# Patient Record
Sex: Female | Born: 1975 | Hispanic: Yes | Marital: Married | Smoking: Never smoker
Health system: Southern US, Community
[De-identification: ages and names within clinical notes are randomized; demographics above are authoritative.]

## PROBLEM LIST (undated history)

## (undated) DIAGNOSIS — G43909 Migraine, unspecified, not intractable, without status migrainosus: Secondary | ICD-10-CM

## (undated) DIAGNOSIS — D649 Anemia, unspecified: Secondary | ICD-10-CM

## (undated) DIAGNOSIS — K219 Gastro-esophageal reflux disease without esophagitis: Secondary | ICD-10-CM

## (undated) DIAGNOSIS — G4700 Insomnia, unspecified: Secondary | ICD-10-CM

## (undated) DIAGNOSIS — T783XXA Angioneurotic edema, initial encounter: Secondary | ICD-10-CM

## (undated) DIAGNOSIS — L509 Urticaria, unspecified: Secondary | ICD-10-CM

## (undated) HISTORY — DX: Urticaria, unspecified: L50.9

## (undated) HISTORY — DX: Migraine, unspecified, not intractable, without status migrainosus: G43.909

## (undated) HISTORY — DX: Angioneurotic edema, initial encounter: T78.3XXA

## (undated) HISTORY — PX: ABDOMINAL HYSTERECTOMY: SHX81

## (undated) HISTORY — DX: Insomnia, unspecified: G47.00

## (undated) HISTORY — DX: Anemia, unspecified: D64.9

---

## 1998-10-07 HISTORY — PX: LITHOTRIPSY: SUR834

## 1999-09-21 ENCOUNTER — Encounter: Payer: Self-pay | Admitting: Urology

## 1999-09-21 ENCOUNTER — Ambulatory Visit (HOSPITAL_COMMUNITY): Admission: RE | Admit: 1999-09-21 | Discharge: 1999-09-21 | Payer: Self-pay | Admitting: Urology

## 1999-09-27 ENCOUNTER — Encounter: Payer: Self-pay | Admitting: Urology

## 1999-09-27 ENCOUNTER — Encounter: Admission: RE | Admit: 1999-09-27 | Discharge: 1999-09-27 | Payer: Self-pay | Admitting: Urology

## 2001-12-17 ENCOUNTER — Other Ambulatory Visit: Admission: RE | Admit: 2001-12-17 | Discharge: 2001-12-17 | Payer: Self-pay | Admitting: Gynecology

## 2003-02-02 ENCOUNTER — Other Ambulatory Visit: Admission: RE | Admit: 2003-02-02 | Discharge: 2003-02-02 | Payer: Self-pay | Admitting: Internal Medicine

## 2003-09-26 ENCOUNTER — Ambulatory Visit (HOSPITAL_COMMUNITY): Admission: RE | Admit: 2003-09-26 | Discharge: 2003-09-26 | Payer: Self-pay | Admitting: Gynecology

## 2004-04-16 ENCOUNTER — Other Ambulatory Visit: Admission: RE | Admit: 2004-04-16 | Discharge: 2004-04-16 | Payer: Self-pay | Admitting: Gynecology

## 2004-12-21 IMAGING — RF DG HYSTEROGRAM
6 series · 6 of 6 positions shown · IV contrast (omnipaque)
Comparison: none

CLINICAL DATA: G1 P1.  Prior c-section.  LMP 09/19/03.  Assess for tubal patency.  
 HYSTEROSALPINGOGRAM:
 After cleansing of the cervix with Betadine, a 5 French hysterosalpingogram catheter was placed in the cervix and the balloon was inflated.  Approximately 7 cc of Omnipaque 300 was injected.
 The uterine cavity is normal.  Both fallopian tubes fill with contrast and are normal in caliber.  There is free intraperitoneal spill bilaterally with no loculation on post-drainage film.  
 IMPRESSION
 Normal hysterosalpingogram.

[Series 1: run · 1 of 1 slices shown (1 of 6)]
[im 1/1]
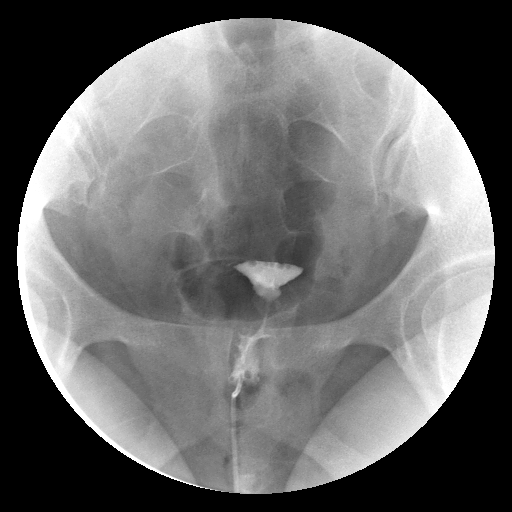

[Series 2: run · 1 of 1 slices shown (2 of 6)]
[im 1/1]
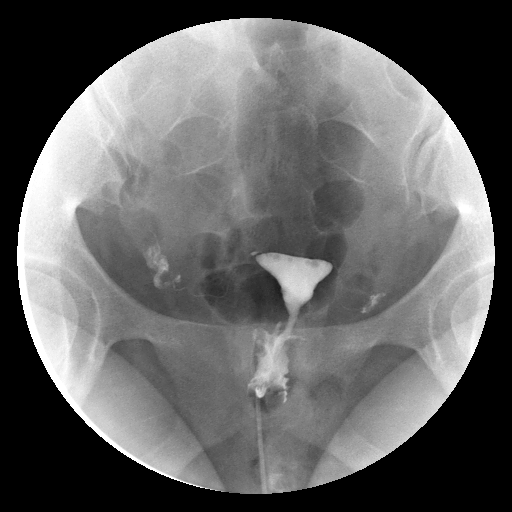

[Series 3: run · 1 of 1 slices shown (3 of 6)]
[im 1/1]
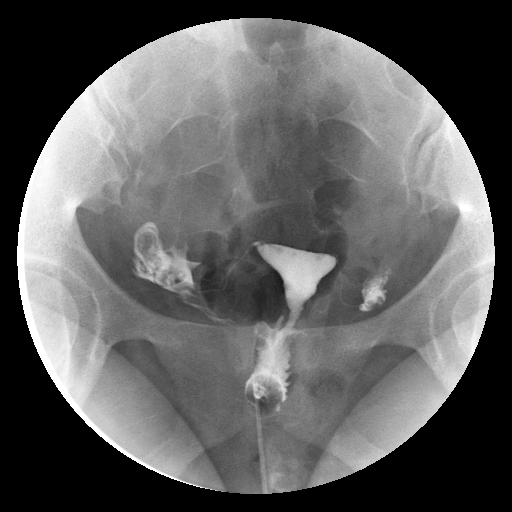

[Series 4: run · 1 of 1 slices shown (4 of 6)]
[im 1/1]
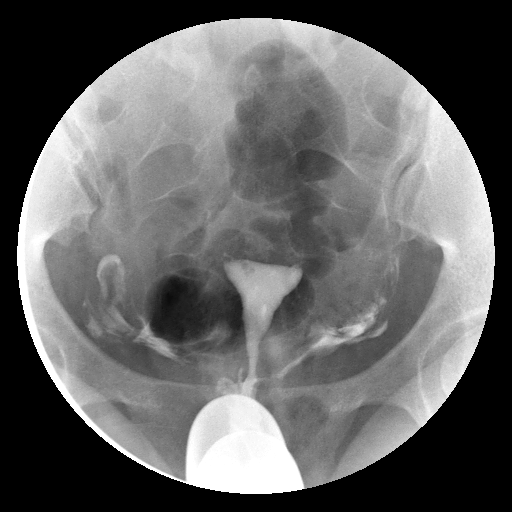

[Series 5: run · 1 of 1 slices shown (5 of 6)]
[im 1/1]
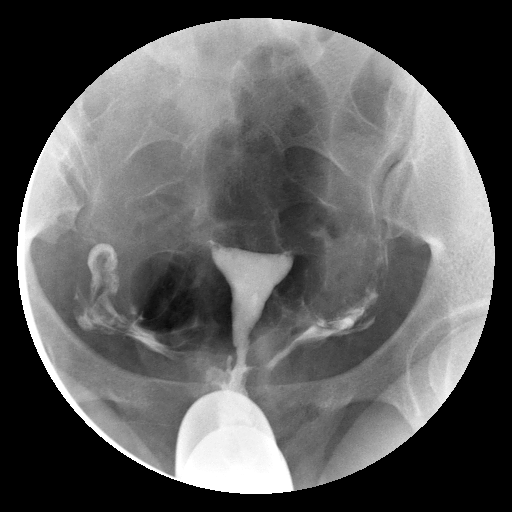

[Series 6: run · 1 of 1 slices shown (6 of 6)]
[im 1/1]
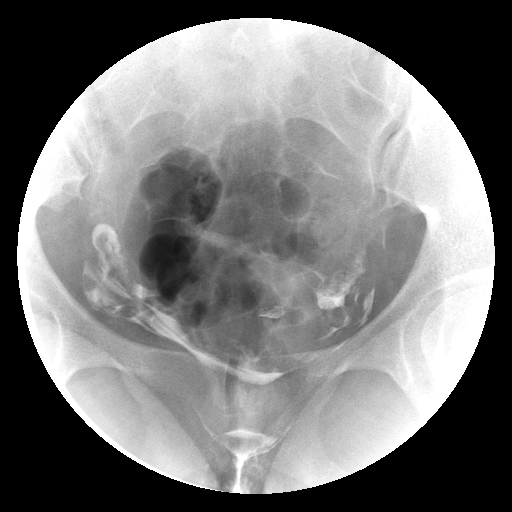

[6 of 6 positions shown; findings below may reference images not displayed]

## 2005-07-29 ENCOUNTER — Other Ambulatory Visit: Admission: RE | Admit: 2005-07-29 | Discharge: 2005-07-29 | Payer: Self-pay | Admitting: Gynecology

## 2005-12-16 ENCOUNTER — Encounter: Admission: RE | Admit: 2005-12-16 | Discharge: 2006-03-16 | Payer: Self-pay | Admitting: Endocrinology

## 2007-01-08 ENCOUNTER — Other Ambulatory Visit: Admission: RE | Admit: 2007-01-08 | Discharge: 2007-01-08 | Payer: Self-pay | Admitting: Gynecology

## 2008-02-16 ENCOUNTER — Other Ambulatory Visit: Admission: RE | Admit: 2008-02-16 | Discharge: 2008-02-16 | Payer: Self-pay | Admitting: Gynecology

## 2009-03-01 ENCOUNTER — Inpatient Hospital Stay (HOSPITAL_COMMUNITY): Admission: AD | Admit: 2009-03-01 | Discharge: 2009-03-01 | Payer: Self-pay | Admitting: Obstetrics & Gynecology

## 2009-04-07 ENCOUNTER — Other Ambulatory Visit: Payer: Self-pay | Admitting: Obstetrics and Gynecology

## 2009-04-11 ENCOUNTER — Inpatient Hospital Stay (HOSPITAL_COMMUNITY): Admission: AD | Admit: 2009-04-11 | Discharge: 2009-04-13 | Payer: Self-pay | Admitting: Obstetrics and Gynecology

## 2009-04-11 ENCOUNTER — Encounter (INDEPENDENT_AMBULATORY_CARE_PROVIDER_SITE_OTHER): Payer: Self-pay | Admitting: Obstetrics and Gynecology

## 2010-09-28 ENCOUNTER — Other Ambulatory Visit
Admission: RE | Admit: 2010-09-28 | Discharge: 2010-09-28 | Payer: Self-pay | Source: Home / Self Care | Admitting: Family Medicine

## 2011-01-13 LAB — CBC
HCT: 26.5 % — ABNORMAL LOW (ref 36.0–46.0)
HCT: 32 % — ABNORMAL LOW (ref 36.0–46.0)
Hemoglobin: 11 g/dL — ABNORMAL LOW (ref 12.0–15.0)
Hemoglobin: 8.9 g/dL — ABNORMAL LOW (ref 12.0–15.0)
MCHC: 33.5 g/dL (ref 30.0–36.0)
MCHC: 34.3 g/dL (ref 30.0–36.0)
MCV: 77.5 fL — ABNORMAL LOW (ref 78.0–100.0)
MCV: 78.2 fL (ref 78.0–100.0)
Platelets: 162 10*3/uL (ref 150–400)
Platelets: 183 10*3/uL (ref 150–400)
RBC: 3.39 MIL/uL — ABNORMAL LOW (ref 3.87–5.11)
RBC: 4.13 MIL/uL (ref 3.87–5.11)
RDW: 17.4 % — ABNORMAL HIGH (ref 11.5–15.5)
RDW: 17.6 % — ABNORMAL HIGH (ref 11.5–15.5)
WBC: 10.3 10*3/uL (ref 4.0–10.5)
WBC: 10.7 10*3/uL — ABNORMAL HIGH (ref 4.0–10.5)

## 2011-01-13 LAB — RPR: RPR Ser Ql: NONREACTIVE

## 2011-01-15 LAB — URINALYSIS, ROUTINE W REFLEX MICROSCOPIC
Bilirubin Urine: NEGATIVE
Glucose, UA: NEGATIVE mg/dL
Hgb urine dipstick: NEGATIVE
Ketones, ur: NEGATIVE mg/dL
Nitrite: NEGATIVE
Protein, ur: NEGATIVE mg/dL
Specific Gravity, Urine: 1.025 (ref 1.005–1.030)
Urobilinogen, UA: 0.2 mg/dL (ref 0.0–1.0)
pH: 5.5 (ref 5.0–8.0)

## 2011-01-15 LAB — DIFFERENTIAL
Basophils Absolute: 0 10*3/uL (ref 0.0–0.1)
Basophils Relative: 0 % (ref 0–1)
Lymphocytes Relative: 14 % (ref 12–46)
Neutro Abs: 11.2 10*3/uL — ABNORMAL HIGH (ref 1.7–7.7)
Neutrophils Relative %: 81 % — ABNORMAL HIGH (ref 43–77)

## 2011-01-15 LAB — CBC
MCHC: 33.9 g/dL (ref 30.0–36.0)
Platelets: 212 10*3/uL (ref 150–400)
RDW: 16.8 % — ABNORMAL HIGH (ref 11.5–15.5)

## 2011-02-19 NOTE — Op Note (Signed)
NAME:  Whitney Schultz, Whitney Schultz         ACCOUNT NO.:  [id number]   MEDICAL RECORD NO.:  [id number]          PATIENT TYPE:  INP   LOCATION:  9130                          FACILITY:  [facility]   PHYSICIAN:  Malva Limes, M.D.    DATE OF BIRTH:  June 05, 1976   DATE OF PROCEDURE:  DATE OF DISCHARGE:                               OPERATIVE REPORT   PREOPERATIVE DIAGNOSES:  1. Intrauterine pregnancy at term.  2. History of prior cesarean section.  3. The patient desires repeat cesarean section.  4. The patient desires permanent sterilization.   POSTOPERATIVE DIAGNOSES:  1. Intrauterine pregnancy at term.  2. History of prior cesarean section.  3. The patient desires repeat cesarean section.  4. The patient desires permanent sterilization.  5. Lysis of adhesions.   SURGEON:  Malva Limes, MD   ANESTHESIA:  Spinal.   ANTIBIOTICS:  Ancef 1 g.   DRAINS:  Foley bedside drainage.   ESTIMATED BLOOD LOSS:  900 mL.   SPECIMENS:  Portion of right and left fallopian tube sent to Pathology.   COMPLICATIONS:  None.   PROCEDURE IN DETAIL:  The patient was taken to the operating room where  a spinal anesthetic was administered without difficulty.  She was then  placed in dorsal supine position with a left lateral tilt.  She was  prepped and draped in the usual fashion for this procedure.  A Foley  catheter was placed in the bladder.  A Pfannenstiel incision was made  through the previous scar.  This was carried down with the subcuticular  layers where the fascia was entered and extended laterally.  The rectus  muscles were then dissected from the fascia.  Rectus muscles were  divided in the midline, taken superiorly and inferiorly.  The parietal  peritoneum was opened sharply.  On entering the abdominal cavity, the  patient was noted to have several omental adhesions.  There was also  adhesions between the anterior abdominal wall and the midportion of the  body of the uterus.  These were all taken  down with a Bovie.  At this  point, the bladder flap was taken down sharply.  A low transverse  uterine incision was made in the midline and extended laterally with  Blunt dissection.  Amniotic sac was entered.  The fluid was noted to be  clear.  The infant was then delivered in a vertex presentation.  On  delivery of the head, the oropharynx and nostrils were bulb suctioned.  The remaining infant was then delivered.  The cord doubly clamped and  cut and the infant handed to the awaiting NICU team.  Cord blood was  then obtained.  Placenta was manually removed.  The uterus was  exteriorized.  Uterine cavity was cleaned with a wet lap.  The uterine  incision was closed in a single layer of 0 Monocryl suture in a running  locking fashion.  Bladder flap was not repaired.  The right fallopian  tube was then grasped in the isthmic portion.  A 2-3 cm knuckle of  fallopian tube was doubly ligated with 0 gut suture.  The knuckle was  excised.  Both ostia were visualized.  Hemostasis appeared to be  adequate.  A similar procedure was performed on the left side.  The  uterus was then placed back in the abdominal cavity and doing this, it  was noted that there was an area of bleeding at the site of the tubal  ligation in the broad ligament.  This was made hemostatic with an  interrupted suture of 2-0 Monocryl.  Hemostasis again checked and all  found to be normal.  The parietal peritoneum and rectus muscles were  then reapproximated in the midline using 2-0 Monocryl in a running  fashion.  The fascia was closed using 0 Monocryl suture in running  fashion.  Subcuticular tissue was made hemostatic with the Bovie.  The  subcuticular tissue was then closed using 2-0 plain gut suture.  The  skin was closed using 3-0 Vicryl in a subcuticular fashion.  Steri-  Strips were then placed.  The patient tolerated the procedure well.  She  was taken to the recovery room in stable condition.  Instrument and lap   counts were correct x2.           ______________________________  Malva Limes, M.D.     MA/MEDQ  D:  04/11/2009  T:  04/12/2009  Job:  [id number]

## 2011-02-22 NOTE — Discharge Summary (Signed)
NAME:  Whitney Schultz, Whitney Schultz          ACCOUNT NO.:  [id number]   MEDICAL RECORD NO.:  [id number]          PATIENT TYPE:  INP   LOCATION:  9130                          FACILITY:  [facility]   PHYSICIAN:  Malva Limes, M.D.    DATE OF BIRTH:  [birth year]   DATE OF ADMISSION:  04/11/2009  DATE OF DISCHARGE:  04/13/2009                               DISCHARGE SUMMARY   FINAL DIAGNOSES:  1. Intrauterine pregnancy at term.  2. History of prior cesarean section.  3. The patient desires repeat cesarean section.  4. The patient desires permanent sterilization.   PROCEDURE:  Repeat low transverse cesarean section with lysis of  adhesions.   SURGEON:  Malva Limes, MD.   COMPLICATIONS:  None.   This 35 year old G2, P1-0-0-1, presents at term for repeat cesarean  section and permanent sterilization.  The patient was offered VBAC and  declined.  The patient's antepartum course up to this point had been  uncomplicated.  She did have a regular heart rate around 31 weeks, had a  fetal echo that was normal.  The patient was taken to the operating room  on April 11, 2009, where a repeat low transverse cesarean section was  performed with the delivery of a 8-pound 7-ounce female infant with Apgars  of 9 and 9.  Delivery went without complications.  The patient's  postoperative course was benign without any significant fevers.  She was  ready for discharge on postoperative day #2.  She was sent home on a  regular diet, told to decrease activities, told to continue her prenatal  vitamins and her iron supplement daily, was given a prescription for  Percocet 1-2 every 4-6 hours as needed for pain, was to follow up in our  office in 4-6 weeks.  Instructions and precautions were reviewed with  the patient.   LABORATORY ON DISCHARGE:  The patient had a hemoglobin of 8.9, white  blood cell count of 10.7, and platelets of 162,000.      Leilani Able, P.A.-C.    ______________________________  Malva Limes, M.D.    MB/MEDQ  D:  04/24/2009  T:  04/25/2009  Job:  [id number]

## 2012-11-11 ENCOUNTER — Encounter: Payer: Self-pay | Admitting: Gynecology

## 2012-11-11 ENCOUNTER — Other Ambulatory Visit (HOSPITAL_COMMUNITY)
Admission: RE | Admit: 2012-11-11 | Discharge: 2012-11-11 | Disposition: A | Discharge status: Home / Self Care | Payer: Managed Care, Other (non HMO) | Source: Ambulatory Visit | Attending: Gynecology | Admitting: Gynecology

## 2012-11-11 ENCOUNTER — Ambulatory Visit (INDEPENDENT_AMBULATORY_CARE_PROVIDER_SITE_OTHER): Payer: Managed Care, Other (non HMO) | Admitting: Gynecology

## 2012-11-11 VITALS — BP 132/88 | Ht 64.25 in | Wt 278.0 lb

## 2012-11-11 DIAGNOSIS — G43909 Migraine, unspecified, not intractable, without status migrainosus: Secondary | ICD-10-CM | POA: Insufficient documentation

## 2012-11-11 DIAGNOSIS — R635 Abnormal weight gain: Secondary | ICD-10-CM

## 2012-11-11 DIAGNOSIS — Z1151 Encounter for screening for human papillomavirus (HPV): Secondary | ICD-10-CM | POA: Insufficient documentation

## 2012-11-11 DIAGNOSIS — Z01419 Encounter for gynecological examination (general) (routine) without abnormal findings: Secondary | ICD-10-CM

## 2012-11-11 DIAGNOSIS — N938 Other specified abnormal uterine and vaginal bleeding: Secondary | ICD-10-CM

## 2012-11-11 DIAGNOSIS — N930 Postcoital and contact bleeding: Secondary | ICD-10-CM | POA: Insufficient documentation

## 2012-11-11 DIAGNOSIS — N949 Unspecified condition associated with female genital organs and menstrual cycle: Secondary | ICD-10-CM

## 2012-11-11 LAB — GLUCOSE, RANDOM: Glucose, Bld: 93 mg/dL (ref 70–99)

## 2012-11-11 LAB — LIPID PANEL
Cholesterol: 152 mg/dL (ref 0–200)
Triglycerides: 114 mg/dL (ref <150)
VLDL: 23 mg/dL (ref 0–40)

## 2012-11-11 NOTE — Patient Instructions (Addendum)

## 2012-11-11 NOTE — Progress Notes (Signed)
Dr. Glenetta Hew asked me to check ins benefits for Her Option Ablation.  Patient has no copayments on her plan so all charges go towards her $3200 family ded ($216.31 met) and her 80/20 co-insurance with $7200 oop max.  Would cost patient $3304.00.  No precertification required per Brett Canales at [place].

## 2012-11-11 NOTE — Progress Notes (Addendum)
Whitney Schultz [birth year] [id number]   History:    37 y.o.  for annual gyn exam who has not been seen the office since 2009. Patient stated she had a cesarean section with tubal sterilization in 2010. Her primary physician Dr. Gweneth Dimitri had done her lab work last year and Pap smear. Patient states she's always had normal Pap smears. She does her monthly self breast examination. Her main complaint today is that for the past 5 months she has had intermenstrual bleeding. She's also had postcoital bleeding as well. She denies any pain or any other symptom. She denies nipple discharge or visual disturbances. Occasionally she suffers from migraines. Patient is overweight with a BMI of 47.35 kg/meter squared. Patient in 2009 was evaluated by the gastroenterologist for iron deficiency anemia and had an EGD and colonoscopy which were benign.  Past medical history,surgical history, family history and social history were all reviewed and documented in the EPIC chart.  Gynecologic History Patient's last menstrual period was 10/16/2012. Contraception: tubal ligation Last Pap: 2012. Results were: normal Last mammogram: Not indicated. Results were: Not indicated  Obstetric History OB History    Grav Para Term Preterm Abortions TAB SAB Ect Mult Living   2 2        2      # Outc Date GA Lbr Len/2nd Wgt Sex Del Anes PTL Lv   1 PAR            2 PAR                ROS: A ROS was performed and pertinent positives and negatives are included in the history.  GENERAL: No fevers or chills. HEENT: No change in vision, no earache, sore throat or sinus congestion. NECK: No pain or stiffness. CARDIOVASCULAR: No chest pain or pressure. No palpitations. PULMONARY: No shortness of breath, cough or wheeze. GASTROINTESTINAL: No abdominal pain, nausea, vomiting or diarrhea, melena or bright red blood per rectum. GENITOURINARY: No urinary frequency, urgency, hesitancy or dysuria. MUSCULOSKELETAL: No joint or muscle  pain, no back pain, no recent trauma. DERMATOLOGIC: No rash, no itching, no lesions. ENDOCRINE: No polyuria, polydipsia, no heat or cold intolerance. No recent change in weight. HEMATOLOGICAL: No anemia or easy bruising or bleeding. NEUROLOGIC: No headache, seizures, numbness, tingling or weakness. PSYCHIATRIC: No depression, no loss of interest in normal activity or change in sleep pattern.     Exam: chaperone present  BP 132/88  Ht 5' 4.25" (1.632 m)  Wt 278 lb (126.1 kg)  BMI 47.35 kg/m2  LMP 10/16/2012  Body mass index is 47.35 kg/(m^2).  General appearance : Well developed well nourished female. No acute distress HEENT: Neck supple, trachea midline, no carotid bruits, no thyroidmegaly Lungs: Clear to auscultation, no rhonchi or wheezes, or rib retractions  Heart: Regular rate and rhythm, no murmurs or gallops Breast:Examined in sitting and supine position were symmetrical in appearance, no palpable masses or tenderness,  no skin retraction, no nipple inversion, no nipple discharge, no skin discoloration, no axillary or supraclavicular lymphadenopathy Abdomen: no palpable masses or tenderness, no rebound or guarding Extremities: no edema or skin discoloration or tenderness  Pelvic:  Bartholin, Urethra, Skene Glands: Within normal limits             Vagina: No gross lesions or discharge slight blood  Cervix: No gross lesions or discharge  Uterus  anteverted, normal size, shape and consistency, non-tender and mobile (limited due to patient's abdominal girth)  Adnexa  Without masses or tenderness (  limited due to patient abdominal girth)  Anus and perineum  normal   Rectovaginal  normal sphincter tone without palpated masses or tenderness             Hemoccult not indicated   Patient was counseled for an endometrial biopsy. The cervix was cleansed with Betadine solution. A single-tooth tenaculum was placed on the anterior cervical lip. The uterus sounded to 9-1/2 cm. A sterile Pipelle  had been introduced and moderate amount of tissue was obtained and submitted for histological evaluation.  Assessment/Plan:  37 y.o. female for annual exam who for the past 5 months has been complaining of intermenstrual bleeding and postcoital bleeding. Endometrial biopsy done today. Patient will return back to the office in 1-2 weeks for sonohysterogram. The following labs were ordered today: Prolactin, TSH, CBC, fasting blood sugar, urinalysis and Pap smear. She was reminded to do her monthly self breast examination. Options discussed such as endometrial ablation if all the above benign.    Ok Edwards MD, 10:03 AM 11/11/2012

## 2012-11-12 LAB — URINALYSIS W MICROSCOPIC + REFLEX CULTURE
Casts: NONE SEEN
Crystals: NONE SEEN
Glucose, UA: NEGATIVE mg/dL
Hgb urine dipstick: NEGATIVE
Leukocytes, UA: NEGATIVE
Nitrite: POSITIVE — AB
Specific Gravity, Urine: 1.026 (ref 1.005–1.030)
Squamous Epithelial / LPF: NONE SEEN
pH: 5.5 (ref 5.0–8.0)

## 2012-11-12 LAB — CBC WITH DIFFERENTIAL/PLATELET
Basophils Absolute: 0 10*3/uL (ref 0.0–0.1)
Basophils Relative: 0 % (ref 0–1)
HCT: 33.4 % — ABNORMAL LOW (ref 36.0–46.0)
Hemoglobin: 10.4 g/dL — ABNORMAL LOW (ref 12.0–15.0)
Lymphocytes Relative: 30 % (ref 12–46)
Monocytes Absolute: 0.5 10*3/uL (ref 0.1–1.0)
Monocytes Relative: 5 % (ref 3–12)
Neutro Abs: 6.3 10*3/uL (ref 1.7–7.7)
Neutrophils Relative %: 62 % (ref 43–77)
RDW: 16.8 % — ABNORMAL HIGH (ref 11.5–15.5)
WBC: 10.1 10*3/uL (ref 4.0–10.5)

## 2012-11-12 LAB — PROLACTIN: Prolactin: 8.6 ng/mL

## 2012-11-13 ENCOUNTER — Other Ambulatory Visit: Payer: Self-pay | Admitting: Gynecology

## 2012-11-13 MED ORDER — NITROFURANTOIN MONOHYD MACRO 100 MG PO CAPS: 100.0000 mg | ORAL_CAPSULE | Freq: Two times a day (BID) | ORAL | Status: DC

## 2012-11-21 ENCOUNTER — Other Ambulatory Visit: Payer: Self-pay

## 2012-11-23 ENCOUNTER — Other Ambulatory Visit: Payer: Self-pay | Admitting: Gynecology

## 2012-11-23 DIAGNOSIS — N938 Other specified abnormal uterine and vaginal bleeding: Secondary | ICD-10-CM

## 2012-11-25 ENCOUNTER — Ambulatory Visit (INDEPENDENT_AMBULATORY_CARE_PROVIDER_SITE_OTHER): Payer: Managed Care, Other (non HMO) | Admitting: Gynecology

## 2012-11-25 ENCOUNTER — Ambulatory Visit (INDEPENDENT_AMBULATORY_CARE_PROVIDER_SITE_OTHER): Payer: Managed Care, Other (non HMO)

## 2012-11-25 DIAGNOSIS — N949 Unspecified condition associated with female genital organs and menstrual cycle: Secondary | ICD-10-CM

## 2012-11-25 DIAGNOSIS — N938 Other specified abnormal uterine and vaginal bleeding: Secondary | ICD-10-CM

## 2012-11-25 DIAGNOSIS — D649 Anemia, unspecified: Secondary | ICD-10-CM

## 2012-11-25 DIAGNOSIS — N930 Postcoital and contact bleeding: Secondary | ICD-10-CM

## 2012-11-25 DIAGNOSIS — R9389 Abnormal findings on diagnostic imaging of other specified body structures: Secondary | ICD-10-CM

## 2012-11-25 MED ORDER — MEGESTROL ACETATE 40 MG PO TABS: ORAL_TABLET | ORAL | Status: DC

## 2012-11-25 NOTE — Progress Notes (Signed)
Patient presented to the office for followup sonohysterogram and to discuss her recent lab work and biopsy as a result of the complaint that patient had on office visit of 11/11/2012.Her main complain has been that for the past 5 months she has had intermenstrual bleeding. She's also had postcoital bleeding as well. She denies any pain or any other symptom. Patient has a prior tubal ligation.  Her recent labs demonstrated that her CBC indicated that her hemoglobin was low at 10.4 and she had a normal platelet count. Her lipid profile and blood sugar and TSH and prolactin were normal as was her last Pap smear.  Sonohysterogram: Uterus measured 8.7 x 5.6 x 4.5 cm with endometrial stripe of 17.8 mm. Patient's last menses was first week in February. Endometrium was prominent. Right ovary left ovary were normal with several follicles. So infusion histogram demonstrated a thickened endometrium anterior wall 5.1 mm and posterior wall 8.3 mm.  Assessment/plan: Overweight patient with dysfunctional uterine bleeding for several months. Normal endometrial biopsy and normal labs with the exception of anemia. We discussed several options. Patient not interested in a Mirena IUD. She will be placed on Megace 40 mg twice a day for 10 days of each month for the next 3 months. She'll return back to the office after the 3 months for sonohysterogram to reassess endometrial cavity. We will then monitor her cycles on her own to see if there back to normal. If not patient would like to proceed with definitive surgery either ablation or hysterectomy. Patient has had 2 prior cesarean sections.

## 2012-11-25 NOTE — Patient Instructions (Signed)
Megestrol tablets What is this medicine? MEGESTROL (me JES trol) belongs to a class of drugs known as progestins. Megestrol tablets are used to treat advanced breast or endometrial cancer. This medicine may be used for other purposes; ask your health care provider or pharmacist if you have questions. What should I tell my health care provider before I take this medicine? They need to know if you have any of these conditions: -adrenal gland problems -history of blood clots of the legs, lungs, or other parts of the body -diabetes -kidney disease -liver disease -stroke -an unusual or allergic reaction to megestrol, other medicines, foods, dyes, or preservatives -pregnant or trying to get pregnant -breast-feeding How should I use this medicine? Take this medicine by mouth. Follow the directions on the prescription label. Do not take your medicine more often than directed. Take your doses at regular intervals. Do not stop taking except on the advice of your doctor or health care professional. Talk to your pediatrician regarding the use of this medicine in children. Special care may be needed. Overdosage: If you think you have taken too much of this medicine contact a poison control center or emergency room at once. NOTE: This medicine is only for you. Do not share this medicine with others. What if I miss a dose? If you miss a dose, take it as soon as you can. If it is almost time for your next dose, take only that dose. Do not take double or extra doses. What may interact with this medicine? Do not take this medicine with any of the following medications: -dofetilide This medicine may also interact with the following medications: -carbamazepine -indinavir -phenobarbital -phenytoin -primidone -rifampin -warfarin This list may not describe all possible interactions. Give your health care provider a list of all the medicines, herbs, non-prescription drugs, or dietary supplements you use.  Also tell them if you smoke, drink alcohol, or use illegal drugs. Some items may interact with your medicine. What should I watch for while using this medicine? Visit your doctor or health care professional for regular checks on your progress. Continue taking this medicine even if you feel better. It may take 2 months of regular use before you know if this medicine is working for your condition. If you are a female of child-bearing age, use an effective method of birth control while you are taking this medicine. This medicine should not be used by females who are pregnant or breast-feeding. There is a potential for serious side effects to an unborn child or to an infant. Talk to your health care professional or pharmacist for more information. If you have diabetes, this medicine may affect blood sugar levels. Check your blood sugar and talk to your doctor or health care professional if you notice changes. What side effects may I notice from receiving this medicine? Side effects that you should report to your doctor or health care professional as soon as possible: -difficulty breathing or shortness of breath -chest pain -dizziness -fluid retention -increased blood pressure -leg pain or swelling -nausea and vomiting -skin rash or itching -weakness Side effects that usually do not require medical attention (report to your doctor or health care professional if they continue or are bothersome): -breakthrough menstrual bleeding -hot flashes or flushing -increased appetite -mood changes -sweating -weight gain This list may not describe all possible side effects. Call your doctor for medical advice about side effects. You may report side effects to FDA at 1-800-FDA-1088. Where should I keep my medicine? Keep out  of the reach of children. Store at controlled room temperature between 15 and 30 degrees C (59 and 86 degrees F). Protect from heat above 40 degrees C (104 degrees F). Throw away any unused  medicine after the expiration date. NOTE: This sheet is a summary. It may not cover all possible information. If you have questions about this medicine, talk to your doctor, pharmacist, or health care provider.  2013, [employer]. (04/11/2008 3:57:10 PM)

## 2012-12-08 ENCOUNTER — Encounter: Payer: Self-pay | Admitting: Gynecology

## 2012-12-10 ENCOUNTER — Telehealth: Payer: Self-pay | Admitting: *Deleted

## 2012-12-10 NOTE — Telephone Encounter (Signed)
Pt emailed  Back with this information KW

## 2012-12-10 NOTE — Telephone Encounter (Signed)
She needs to come into the office for consultation to discuss the options that were discussed with her when she was last seen.

## 2012-12-10 NOTE — Telephone Encounter (Signed)
Pt sent in an email where she has completed her first month of Megesterol as you had prescribed to take for 12 days each month for 3 months then repeat SHGM. She is stating she doesn't want to take month two or three due to the side efffects she is having. She has complained of mood swings, irritiability, anxious, very emotional, tearful and feels like she is on edge  All the time. She asks if there is anything else she can take or please advise.

## 2012-12-14 ENCOUNTER — Ambulatory Visit (INDEPENDENT_AMBULATORY_CARE_PROVIDER_SITE_OTHER): Payer: Managed Care, Other (non HMO) | Admitting: Gynecology

## 2012-12-14 ENCOUNTER — Encounter: Payer: Self-pay | Admitting: Gynecology

## 2012-12-14 VITALS — BP 132/80

## 2012-12-14 DIAGNOSIS — N925 Other specified irregular menstruation: Secondary | ICD-10-CM

## 2012-12-14 DIAGNOSIS — D649 Anemia, unspecified: Secondary | ICD-10-CM

## 2012-12-14 DIAGNOSIS — N949 Unspecified condition associated with female genital organs and menstrual cycle: Secondary | ICD-10-CM

## 2012-12-14 DIAGNOSIS — N938 Other specified abnormal uterine and vaginal bleeding: Secondary | ICD-10-CM

## 2012-12-14 NOTE — Progress Notes (Signed)
Patient is a 37 year old gravida 2 para 2 who presented to the office today to further discuss her medical management for dysfunction uterine bleeding. Patient's main complaint that for the past 5 months she continues to have intermenstrual bleeding. She also has complained of postcoital bleeding as well. She denied any pain. Patient with prior tubal ligation.  Her recent labs demonstrated that her CBC indicated that her hemoglobin was low at 10.4 and she had a normal platelet count. Her lipid profile and blood sugar and TSH and prolactin were normal as was her last Pap smear.  Her endometrial biopsy 11/11/2012 as follows Endometrium, biopsy - BENIGN SECRETORY ENDOMETRIUM, NO ATYPIA, HYPERPLASIA OR MALIGNANCY.  Sonohysterogram:  Uterus measured 8.7 x 5.6 x 4.5 cm with endometrial stripe of 17.8 mm. Patient's last menses was first week in February. Endometrium was prominent. Right ovary left ovary were normal with several follicles. So infusion histogram demonstrated a thickened endometrium anterior wall 5.1 mm and posterior wall 8.3 mm  Patient is overweight and as a result of her dysfunctional uterine bleeding she was going to be placed on Megace 40 mg twice day for 10 days for 3 months and and followup with a ultrasound for endometrial thickness or possibly repeating her endometrial biopsy. Patient stated she took the Megace for 10 days but cannot tolerate the side effects and would like to proceed with definitive treatment such as with a hysterectomy. She had been on Norplant in the past for 4 years and also Mirena for one year continue to have the dysfunctional uterine bleeding and she is frustrated.  Assessment/plan: Patient with persistent dysfunction uterine bleeding despite negative workup. Patient overweight high risk for endometrial hyperplasia. Recent sonohysterogram demonstrated endometrial thickness but pathology report was benign on endometrial biopsy. Patient is currently on iron  supplementation as a result of her anemia. Her last hemoglobin was 10.4. She will be asked to increase her iron supplementation twice a day. She has had 2 prior cesarean sections. We are going to plan on scheduling a total laparoscopic hysterectomy sometime in mid April and we'll check her CBC as part of her preoperative visit the week before her surgery. Literature information was provided. Of note patient had a previous tubal sterilization procedure.

## 2012-12-14 NOTE — Patient Instructions (Signed)
Total Laparoscopic Hysterectomy A total laparoscopic hysterectomy is a minimally invasive surgery to remove your uterus and cervix. This surgery is performed by making several small cuts (incisions) in your abdomen. It can also be done with a thin, lighted tube (laparoscope) inserted into 2 small incisions in the lower abdomen. Your fallopian tubes and ovaries can be removed (bilateral salpingo-oopherectomy) during this surgery as well.If a total laparoscopic hysterectomy is started and it is not safe to continue, the laparoscopic surgery will be converted to an open abdominal surgery. You will not have menstrual periods or be able to get pregnant after having this surgery. If a bilateral salpingo-oopherectomy was performed before menopause, you will go through a sudden (abrupt) menopause. This can be helped with hormone medicines. Benefits of minimally invasive surgery include:  Less pain.  Less risk of blood loss.  Less risk of infection.  Quicker return to normal activities.  Usually a 1 night stay in the hospital.  Overall patient satisfaction. LET YOUR CAREGIVER KNOW ABOUT:  Any history of abnormal Pap tests.  Allergies to food or medicine.  Medicines taken, including vitamins, herbs, eyedrops, over-the-counter medicines, and creams.  Use of steroids (by mouth or creams).  Previous problems with anesthetics or numbing medicines.  History of bleeding problems or blood clots.  Previous surgery.  Other health problems, including diabetes and kidney problems.  Desire for future fertility.  Any infections or colds you may have developed.  Symptoms of irregular or heavy periods, weight loss, or urinary or bowel changes. RISKS AND COMPLICATIONS   Bleeding.  Blood clots in the legs or lung.  Infection.  Injury to surrounding organs.  Problems with anesthesia.  Early menopause symptoms (hot flashes, night sweats, insomnia).  Risk of conversion to an open abdominal  incision. BEFORE THE PROCEDURE  Ask your caregiver about changing or stopping your regular medicines.  Do not take aspirin or blood thinners (anticoagulants) for 1 week before the surgery, or as told by your caregiver.  Do not eat or drink anything for 8 hours before the surgery, or as told by your caregiver.  Quit smoking if you smoke.  Arrange for a ride home after surgery and for someone to help you at home during recovery. PROCEDURE   You will be given antibiotic medicine.  An intravenous (IV) line will be placed in your arm. You will be given medicine to make you sleep (general anesthetic).  A gas (carbon dioxide) will be used to inflate your abdomen. This will allow your surgeon to look inside your abdomen, perform your surgery, and treat any other problems found if necessary.  Three or four small incisions (often less than  inch) will be made in your abdomen. One of these incisions will be made in the area of your belly button (navel). The laparoscope will be inserted into the incision. Your surgeon will look through the laparoscope while doing your procedure.  Other surgical instruments will be inserted through the other incisions.  The uterus may be removed through the vagina or cut into small pieces and removed through the small incisions.  Your incisions will be closed. AFTER THE PROCEDURE  The gas will be released from inside your abdomen.  You will be taken to the recovery area where a nurse will watch and check your progress. Once you are awake, stable, and taking fluids well, without other problems, you will return to your room or be allowed to go home.  There is usually minimal discomfort following the surgery because   the incisions are so small.  You will be given pain medicine while you are in the hospital and for when you go home.  Try to have someone with you the first 3 to 5 days after you go home.  Follow up with your surgeon in 2 to 4 weeks after surgery  to evaluate your progress. Document Released: 07/21/2007 Document Revised: 12/16/2011 Document Reviewed: 05/10/2011 [employer] Patient Information 2013 [employer].  

## 2012-12-18 ENCOUNTER — Telehealth: Payer: Self-pay

## 2012-12-18 NOTE — Telephone Encounter (Signed)
I spoke with Vikki Ports at [facility]. She checked the CPT code and said no pre-certification required for the CPT code with 23 hours observation stay.

## 2012-12-25 ENCOUNTER — Other Ambulatory Visit: Payer: Self-pay | Admitting: *Deleted

## 2012-12-25 DIAGNOSIS — N938 Other specified abnormal uterine and vaginal bleeding: Secondary | ICD-10-CM

## 2012-12-25 MED ORDER — MEGESTROL ACETATE 40 MG PO TABS: ORAL_TABLET | ORAL | Status: DC

## 2013-01-27 ENCOUNTER — Other Ambulatory Visit: Payer: Self-pay | Admitting: Anesthesiology

## 2013-01-27 ENCOUNTER — Other Ambulatory Visit: Payer: Managed Care, Other (non HMO)

## 2013-01-27 ENCOUNTER — Encounter (HOSPITAL_COMMUNITY): Payer: Self-pay | Admitting: Pharmacy Technician

## 2013-01-27 DIAGNOSIS — D649 Anemia, unspecified: Secondary | ICD-10-CM

## 2013-01-27 LAB — CBC WITH DIFFERENTIAL/PLATELET
Basophils Relative: 0 % (ref 0–1)
Eosinophils Absolute: 0.3 10*3/uL (ref 0.0–0.7)
Eosinophils Relative: 3 % (ref 0–5)
HCT: 32.4 % — ABNORMAL LOW (ref 36.0–46.0)
Hemoglobin: 10.1 g/dL — ABNORMAL LOW (ref 12.0–15.0)
Lymphs Abs: 2.8 10*3/uL (ref 0.7–4.0)
MCH: 21.7 pg — ABNORMAL LOW (ref 26.0–34.0)
MCHC: 31.2 g/dL (ref 30.0–36.0)
MCV: 69.5 fL — ABNORMAL LOW (ref 78.0–100.0)
Monocytes Absolute: 0.4 10*3/uL (ref 0.1–1.0)
Monocytes Relative: 5 % (ref 3–12)
Neutrophils Relative %: 60 % (ref 43–77)
RBC: 4.66 MIL/uL (ref 3.87–5.11)

## 2013-01-28 ENCOUNTER — Other Ambulatory Visit: Payer: Self-pay | Admitting: Gynecology

## 2013-01-28 MED ORDER — MEGESTROL ACETATE 20 MG PO TABS: 20.0000 mg | ORAL_TABLET | Freq: Two times a day (BID) | ORAL | Status: DC

## 2013-02-03 ENCOUNTER — Other Ambulatory Visit: Payer: Self-pay | Admitting: Gynecology

## 2013-02-03 ENCOUNTER — Encounter: Payer: Self-pay | Admitting: Gynecology

## 2013-02-03 ENCOUNTER — Encounter (HOSPITAL_COMMUNITY)
Admission: RE | Admit: 2013-02-03 | Discharge: 2013-02-03 | Disposition: A | Discharge status: Home / Self Care | Payer: Managed Care, Other (non HMO) | Source: Ambulatory Visit | Attending: Gynecology | Admitting: Gynecology

## 2013-02-03 ENCOUNTER — Telehealth: Payer: Self-pay

## 2013-02-03 ENCOUNTER — Encounter (HOSPITAL_COMMUNITY): Payer: Self-pay

## 2013-02-03 ENCOUNTER — Ambulatory Visit (INDEPENDENT_AMBULATORY_CARE_PROVIDER_SITE_OTHER): Payer: Managed Care, Other (non HMO) | Admitting: Gynecology

## 2013-02-03 ENCOUNTER — Institutional Professional Consult (permissible substitution): Payer: Managed Care, Other (non HMO) | Admitting: Gynecology

## 2013-02-03 VITALS — BP 126/78

## 2013-02-03 DIAGNOSIS — D649 Anemia, unspecified: Secondary | ICD-10-CM | POA: Insufficient documentation

## 2013-02-03 DIAGNOSIS — Z01818 Encounter for other preprocedural examination: Secondary | ICD-10-CM

## 2013-02-03 DIAGNOSIS — N920 Excessive and frequent menstruation with regular cycle: Secondary | ICD-10-CM

## 2013-02-03 HISTORY — DX: Gastro-esophageal reflux disease without esophagitis: K21.9

## 2013-02-03 LAB — URINE MICROSCOPIC-ADD ON

## 2013-02-03 LAB — CBC
HCT: 36.3 % (ref 36.0–46.0)
Hemoglobin: 11.4 g/dL — ABNORMAL LOW (ref 12.0–15.0)
MCH: 22.1 pg — ABNORMAL LOW (ref 26.0–34.0)
MCHC: 31.4 g/dL (ref 30.0–36.0)
MCV: 70.5 fL — ABNORMAL LOW (ref 78.0–100.0)
Platelets: 375 K/uL (ref 150–400)
RBC: 5.15 MIL/uL — ABNORMAL HIGH (ref 3.87–5.11)
RDW: 19.5 % — ABNORMAL HIGH (ref 11.5–15.5)
WBC: 11.3 K/uL — ABNORMAL HIGH (ref 4.0–10.5)

## 2013-02-03 LAB — URINALYSIS, ROUTINE W REFLEX MICROSCOPIC
Bilirubin Urine: NEGATIVE
Glucose, UA: NEGATIVE mg/dL
Ketones, ur: NEGATIVE mg/dL
Nitrite: POSITIVE — AB
Protein, ur: NEGATIVE mg/dL
Specific Gravity, Urine: 1.025 (ref 1.005–1.030)
Urobilinogen, UA: 0.2 mg/dL (ref 0.0–1.0)
pH: 6 (ref 5.0–8.0)

## 2013-02-03 LAB — SURGICAL PCR SCREEN: MRSA, PCR: NEGATIVE

## 2013-02-03 MED ORDER — HYDROCODONE-ACETAMINOPHEN 5-325 MG PO TABS: 1.0000 | ORAL_TABLET | Freq: Four times a day (QID) | ORAL | Status: DC | PRN

## 2013-02-03 MED ORDER — METOCLOPRAMIDE HCL 10 MG PO TABS: 10.0000 mg | ORAL_TABLET | Freq: Three times a day (TID) | ORAL | Status: DC

## 2013-02-03 NOTE — Patient Instructions (Addendum)
   Your procedure is scheduled ZO:[id number], May 12th  Enter through the Main Entrance of [facility] at:6am Pick up the phone at the desk and dial [phone] and inform us of your arrival.  Please call this number if you have any problems the morning of surgery: [phone]  Remember: Do not eat or drink anything after midnight on Sunday Please take your omeprazole morning of surgery with sips of water  Do not wear jewelry, make-up, or FINGER nail polish No metal in your hair or on your body. Do not wear lotions, powders, perfumes. You may wear deodorant.  Please use your CHG wash as directed prior to surgery.  Do not shave anywhere for at least 12 hours prior to first CHG shower.  Do not bring valuables to the hospital.   Leave suitcase in the car. After Surgery it may be brought to your room. For patients being admitted to the hospital, checkout time is 11:00am the day of discharge.

## 2013-02-03 NOTE — Telephone Encounter (Signed)
Megace 20 mg twice a day until time of her surgery. Thanks

## 2013-02-03 NOTE — Progress Notes (Signed)
Whitney Schultz is an 37 y.o. female who presented to the office today for preoperative consultation. Patient is a gravida 2 para 2  (2 cesarean sections)who presented to the office today to further discuss her medical management for dysfunction uterine bleeding. Patient's main complaint that for the past 5 months she continues to have intermenstrual bleeding. She also has complained of postcoital bleeding as well. She denied any pain. Patient with prior tubal ligation. Patient was noted to have anemia with a hemoglobin of 10.4 for she's currently on iron 3 times a day. Her last CBC demonstrated her hemoglobin was up to 11.4. Patient had an endometrial biopsy done in the office if everything 2014 with the following result:  Endometrium, biopsy  - BENIGN SECRETORY ENDOMETRIUM, NO ATYPIA, HYPERPLASIA OR MALIGNANCY.  Patient's ultrasound on March 10 with the following result: Uterus measured 8.7 x 5.6 x 4.5 cm with endometrial stripe of 17.8 mm. Patient's last menses was first week in February. Endometrium was prominent. Right ovary left ovary were normal with several follicles. Sono infusion histogram demonstrated a thickened endometrium anterior wall 5.1 mm and posterior wall 8.3 mm.  Patient been currently on Megace 40 mg twice a day along with iron 3 times a day. Patient scheduled to undergo a total laparoscopic hysterectomy with ovarian conservation.  Pertinent Gynecological History: Menses: flow is excessive with use of 4-5 pads or tampons on heaviest days Bleeding: see above Contraception: tubal ligation DES exposure: denies Blood transfusions: none Sexually transmitted diseases: no past history Previous GYN Procedures: 2 cesarean sections  Last mammogram: Not indicated Date: not indicated Last pap: normal Date: 2014 OB History: G2, P2   Menstrual History: Menarche age: 6  Patient's last menstrual period was 01/25/2013.    Past Medical History  Diagnosis Date  . Anemia   .  Migraines   . Insomnia   . GERD (gastroesophageal reflux disease)     Past Surgical History  Procedure Laterality Date  . Cesarean section  [id number]    x2 with BTSP  . Lithotripsy  2000    Family History  Problem Relation Age of Onset  . Hypertension Mother   . Diabetes Maternal Grandmother   . Diabetes Paternal Grandfather     Social History:  reports that she has never smoked. She has never used smokeless tobacco. She reports that she does not drink alcohol or use illicit drugs.  Allergies:  Allergies  Allergen Reactions  . Prednisone Rash     (Not in a hospital admission)  REVIEW OF SYSTEMS: A ROS was performed and pertinent positives and negatives are included in the history.  GENERAL: No fevers or chills. HEENT: No change in vision, no earache, sore throat or sinus congestion. NECK: No pain or stiffness. CARDIOVASCULAR: No chest pain or pressure. No palpitations. PULMONARY: No shortness of breath, cough or wheeze. GASTROINTESTINAL: No abdominal pain, nausea, vomiting or diarrhea, melena or bright red blood per rectum. GENITOURINARY: No urinary frequency, urgency, hesitancy or dysuria. MUSCULOSKELETAL: No joint or muscle pain, no back pain, no recent trauma. DERMATOLOGIC: No rash, no itching, no lesions. ENDOCRINE: No polyuria, polydipsia, no heat or cold intolerance. No recent change in weight. HEMATOLOGICAL: No anemia or easy bruising or bleeding. NEUROLOGIC: No headache, seizures, numbness, tingling or weakness. PSYCHIATRIC: No depression, no loss of interest in normal activity or change in sleep pattern.     Blood pressure 126/78, last menstrual period 01/25/2013.  Physical Exam:  HEENT:unremarkable Neck:Supple, midline, no thyroid megaly, no carotid bruits Lungs:  Clear to auscultation no rhonchi's or wheezes Heart:Regular rate and rhythm, no murmurs or gallops Breast Exam:during time of annual exam was normal  Abdomen:soft nontender no rebound or  guarding Pelvic:BUSwithin normal limits Vagina:dark brown vaginal blood noted Cervix:no lesions or discharge Uterus:anteverted normal size shape and consistency Adnexa:no palpable masses or tenderness Extremities: No cords, no edema Rectal:unremarkable  Results for orders placed during the hospital encounter of 02/03/13 (from the past 24 hour(s))  SURGICAL PCR SCREEN     Status: None   Collection Time    02/03/13  9:50 AM      Result Value Range   MRSA, PCR NEGATIVE  NEGATIVE   Staphylococcus aureus NEGATIVE  NEGATIVE  CBC     Status: Abnormal   Collection Time    02/03/13 10:10 AM      Result Value Range   WBC 11.3 (*) 4.0 - 10.5 K/uL   RBC 5.15 (*) 3.87 - 5.11 MIL/uL   Hemoglobin 11.4 (*) 12.0 - 15.0 g/dL   HCT 13.0  86.5 - 78.4 %   MCV 70.5 (*) 78.0 - 100.0 fL   MCH 22.1 (*) 26.0 - 34.0 pg   MCHC 31.4  30.0 - 36.0 g/dL   RDW 69.6 (*) 29.5 - 28.4 %   Platelets 375  150 - 400 K/uL  URINALYSIS, ROUTINE W REFLEX MICROSCOPIC     Status: Abnormal   Collection Time    02/03/13 10:10 AM      Result Value Range   Color, Urine YELLOW  YELLOW   APPearance CLOUDY (*) CLEAR   Specific Gravity, Urine 1.025  1.005 - 1.030   pH 6.0  5.0 - 8.0   Glucose, UA NEGATIVE  NEGATIVE mg/dL   Hgb urine dipstick LARGE (*) NEGATIVE   Bilirubin Urine NEGATIVE  NEGATIVE   Ketones, ur NEGATIVE  NEGATIVE mg/dL   Protein, ur NEGATIVE  NEGATIVE mg/dL   Urobilinogen, UA 0.2  0.0 - 1.0 mg/dL   Nitrite POSITIVE (*) NEGATIVE   Leukocytes, UA TRACE (*) NEGATIVE  URINE MICROSCOPIC-ADD ON     Status: Abnormal   Collection Time    02/03/13 10:10 AM      Result Value Range   Squamous Epithelial / LPF FEW (*) RARE   WBC, UA 3-6  <3 WBC/hpf   RBC / HPF 0-2  <3 RBC/hpf   Bacteria, UA MANY (*) RARE    No results found.  Assessment/Plan: Patient scheduled to undergo total laparoscopic hysterectomy with ovarian conservation as a result of her refractory menorrhagia despite various treatment options had  been provided. Patient has had 2 previous cesarean sections. The following risk were discussed with the patient as well as literature information provided:                        Patient was counseled as to the risk of surgery to include the following:  1. Infection (prohylactic antibiotics will be administered)  2. DVT/Pulmonary Embolism (prophylactic pneumo compression stockings will be used)  3.Trauma to internal organs requiring additional surgical procedure to repair any injury to     Internal organs requiring perhaps additional hospitalization days.  4.Hemmorhage requiring transfusion and blood products which carry risks such as  anaphylactic reaction, hepatitis and AIDS  Patient had received literature information on the procedure scheduled and all her questions were answered and fully accepts all risk.  Patient will continue her iron tablet 1 by mouth 3 times a day until the day  prior to her surgery as well as her Megace 40 mg twice a day.  [facility] ZOX09:60 PMTD@  FERNANDEZ,JUAN H 02/03/2013, 12:05 PM

## 2013-02-03 NOTE — Telephone Encounter (Signed)
Pharmacy called to increase number of Megace 20 mg tabs to #24 from #20 in order for patient to have enough to take until surgery.  When I looked at your note you had said she should continue on taking her Megace 40 mg bid.  I just wanted to confirm you are okay with her staying on the 20mg  Megace she has been taking.

## 2013-02-04 ENCOUNTER — Other Ambulatory Visit: Payer: Self-pay | Admitting: Gynecology

## 2013-02-04 ENCOUNTER — Other Ambulatory Visit: Payer: Managed Care, Other (non HMO)

## 2013-02-04 MED ORDER — METOCLOPRAMIDE HCL 10 MG PO TABS: 10.0000 mg | ORAL_TABLET | Freq: Three times a day (TID) | ORAL | Status: DC

## 2013-02-04 MED ORDER — CIPROFLOXACIN HCL 250 MG PO TABS: 250.0000 mg | ORAL_TABLET | Freq: Two times a day (BID) | ORAL | Status: DC

## 2013-02-04 MED ORDER — MEGESTROL ACETATE 20 MG PO TABS: 20.0000 mg | ORAL_TABLET | Freq: Every day | ORAL | Status: DC

## 2013-02-04 NOTE — Telephone Encounter (Signed)
Pharmacy notified.

## 2013-02-05 LAB — URINE CULTURE: Colony Count: >=100000

## 2013-02-14 MED ORDER — DEXTROSE 5 % IV SOLN
2.0000 g | INTRAVENOUS | Status: AC
  Administered 2013-02-15: 2 g via INTRAVENOUS
  Filled 2013-02-14 (×2): qty 2

## 2013-02-15 ENCOUNTER — Ambulatory Visit: Payer: Managed Care, Other (non HMO) | Admitting: Gynecology

## 2013-02-15 ENCOUNTER — Observation Stay (HOSPITAL_COMMUNITY)
Admission: RE | Admit: 2013-02-15 | Discharge: 2013-02-16 | Disposition: A | Discharge status: Home / Self Care | Payer: Managed Care, Other (non HMO) | Source: Ambulatory Visit | Attending: Gynecology | Admitting: Gynecology

## 2013-02-15 ENCOUNTER — Encounter (HOSPITAL_COMMUNITY)
Admission: RE | Disposition: A | Discharge status: Home / Self Care | Payer: Self-pay | Source: Ambulatory Visit | Attending: Gynecology

## 2013-02-15 ENCOUNTER — Encounter (HOSPITAL_COMMUNITY): Payer: Self-pay | Admitting: Anesthesiology

## 2013-02-15 ENCOUNTER — Ambulatory Visit (HOSPITAL_COMMUNITY): Payer: Managed Care, Other (non HMO) | Admitting: Anesthesiology

## 2013-02-15 ENCOUNTER — Other Ambulatory Visit: Payer: Managed Care, Other (non HMO)

## 2013-02-15 ENCOUNTER — Encounter (HOSPITAL_COMMUNITY): Payer: Self-pay

## 2013-02-15 DIAGNOSIS — N930 Postcoital and contact bleeding: Secondary | ICD-10-CM | POA: Insufficient documentation

## 2013-02-15 DIAGNOSIS — N920 Excessive and frequent menstruation with regular cycle: Principal | ICD-10-CM | POA: Insufficient documentation

## 2013-02-15 DIAGNOSIS — D649 Anemia, unspecified: Secondary | ICD-10-CM

## 2013-02-15 DIAGNOSIS — N946 Dysmenorrhea, unspecified: Secondary | ICD-10-CM | POA: Insufficient documentation

## 2013-02-15 DIAGNOSIS — R9389 Abnormal findings on diagnostic imaging of other specified body structures: Secondary | ICD-10-CM | POA: Insufficient documentation

## 2013-02-15 DIAGNOSIS — D6489 Other specified anemias: Secondary | ICD-10-CM | POA: Insufficient documentation

## 2013-02-15 DIAGNOSIS — Z9851 Tubal ligation status: Secondary | ICD-10-CM | POA: Insufficient documentation

## 2013-02-15 DIAGNOSIS — Z9889 Other specified postprocedural states: Secondary | ICD-10-CM

## 2013-02-15 DIAGNOSIS — N840 Polyp of corpus uteri: Secondary | ICD-10-CM | POA: Insufficient documentation

## 2013-02-15 HISTORY — PX: LAPAROSCOPIC HYSTERECTOMY: SHX1926

## 2013-02-15 LAB — PREGNANCY, URINE: Preg Test, Ur: NEGATIVE

## 2013-02-15 SURGERY — HYSTERECTOMY, TOTAL, LAPAROSCOPIC
Anesthesia: General | Site: Abdomen | Wound class: Clean Contaminated

## 2013-02-15 MED ORDER — ACETAMINOPHEN 160 MG/5ML PO SOLN: 975.0000 mg | Freq: Once | ORAL | Status: DC

## 2013-02-15 MED ORDER — FENTANYL CITRATE 0.05 MG/ML IJ SOLN
INTRAMUSCULAR | Status: AC
  Filled 2013-02-15: qty 5

## 2013-02-15 MED ORDER — LACTATED RINGERS IR SOLN
Status: DC | PRN
  Administered 2013-02-15: 3000 mL

## 2013-02-15 MED ORDER — PHENYLEPHRINE 40 MCG/ML (10ML) SYRINGE FOR IV PUSH (FOR BLOOD PRESSURE SUPPORT)
PREFILLED_SYRINGE | INTRAVENOUS | Status: AC
  Filled 2013-02-15: qty 5

## 2013-02-15 MED ORDER — PROPOFOL 10 MG/ML IV EMUL
INTRAVENOUS | Status: AC
  Filled 2013-02-15: qty 20

## 2013-02-15 MED ORDER — BUPIVACAINE HCL (PF) 0.25 % IJ SOLN
INTRAMUSCULAR | Status: DC | PRN
  Administered 2013-02-15: 10 mL

## 2013-02-15 MED ORDER — GLYCOPYRROLATE 0.2 MG/ML IJ SOLN
INTRAMUSCULAR | Status: AC
  Filled 2013-02-15: qty 3

## 2013-02-15 MED ORDER — GLYCOPYRROLATE 0.2 MG/ML IJ SOLN
INTRAMUSCULAR | Status: DC | PRN
  Administered 2013-02-15: .5 mg via INTRAVENOUS

## 2013-02-15 MED ORDER — KETOROLAC TROMETHAMINE 30 MG/ML IJ SOLN: 15.0000 mg | Freq: Once | INTRAMUSCULAR | Status: AC | PRN

## 2013-02-15 MED ORDER — SODIUM CHLORIDE 0.9 % IJ SOLN: 9.0000 mL | INTRAMUSCULAR | Status: DC | PRN

## 2013-02-15 MED ORDER — LIDOCAINE HCL (CARDIAC) 20 MG/ML IV SOLN
INTRAVENOUS | Status: AC
  Filled 2013-02-15: qty 5

## 2013-02-15 MED ORDER — IBUPROFEN 600 MG PO TABS
600.0000 mg | ORAL_TABLET | Freq: Four times a day (QID) | ORAL | Status: DC | PRN
  Administered 2013-02-15 – 2013-02-16 (×2): 600 mg via ORAL
  Filled 2013-02-15 (×2): qty 1

## 2013-02-15 MED ORDER — LACTATED RINGERS IV SOLN
INTRAVENOUS | Status: DC
  Administered 2013-02-15 (×2): via INTRAVENOUS
  Administered 2013-02-15: 50 mL/h via INTRAVENOUS

## 2013-02-15 MED ORDER — ACETAMINOPHEN 10 MG/ML IV SOLN
INTRAVENOUS | Status: AC
  Administered 2013-02-15: 1000 mg via INTRAVENOUS
  Filled 2013-02-15: qty 100

## 2013-02-15 MED ORDER — KETOROLAC TROMETHAMINE 30 MG/ML IJ SOLN
INTRAMUSCULAR | Status: AC
  Administered 2013-02-15: 30 mg via INTRAVENOUS
  Filled 2013-02-15: qty 1

## 2013-02-15 MED ORDER — ONDANSETRON HCL 4 MG/2ML IJ SOLN
INTRAMUSCULAR | Status: AC
  Filled 2013-02-15: qty 2

## 2013-02-15 MED ORDER — BUPIVACAINE HCL (PF) 0.25 % IJ SOLN
INTRAMUSCULAR | Status: AC
  Filled 2013-02-15: qty 30

## 2013-02-15 MED ORDER — FENTANYL CITRATE 0.05 MG/ML IJ SOLN
INTRAMUSCULAR | Status: DC | PRN
  Administered 2013-02-15 (×2): 100 ug via INTRAVENOUS
  Administered 2013-02-15 (×5): 50 ug via INTRAVENOUS

## 2013-02-15 MED ORDER — NEOSTIGMINE METHYLSULFATE 1 MG/ML IJ SOLN
INTRAMUSCULAR | Status: AC
  Filled 2013-02-15: qty 1

## 2013-02-15 MED ORDER — LACTATED RINGERS IV SOLN
INTRAVENOUS | Status: DC
  Administered 2013-02-15 – 2013-02-16 (×3): via INTRAVENOUS

## 2013-02-15 MED ORDER — OXYCODONE-ACETAMINOPHEN 5-325 MG PO TABS
1.0000 | ORAL_TABLET | Freq: Four times a day (QID) | ORAL | Status: DC | PRN
  Administered 2013-02-16: 1 via ORAL
  Filled 2013-02-15: qty 1

## 2013-02-15 MED ORDER — MIDAZOLAM HCL 5 MG/5ML IJ SOLN
INTRAMUSCULAR | Status: DC | PRN
  Administered 2013-02-15: 2 mg via INTRAVENOUS

## 2013-02-15 MED ORDER — EPHEDRINE SULFATE 50 MG/ML IJ SOLN
INTRAMUSCULAR | Status: AC
  Filled 2013-02-15: qty 1

## 2013-02-15 MED ORDER — DIPHENHYDRAMINE HCL 12.5 MG/5ML PO ELIX: 12.5000 mg | ORAL_SOLUTION | Freq: Four times a day (QID) | ORAL | Status: DC | PRN

## 2013-02-15 MED ORDER — METOCLOPRAMIDE HCL 10 MG PO TABS: 10.0000 mg | ORAL_TABLET | Freq: Four times a day (QID) | ORAL | Status: DC | PRN

## 2013-02-15 MED ORDER — GLYCOPYRROLATE 0.2 MG/ML IJ SOLN
INTRAMUSCULAR | Status: AC
  Filled 2013-02-15: qty 2

## 2013-02-15 MED ORDER — ROCURONIUM BROMIDE 100 MG/10ML IV SOLN
INTRAVENOUS | Status: DC | PRN
  Administered 2013-02-15: 50 mg via INTRAVENOUS
  Administered 2013-02-15: 20 mg via INTRAVENOUS

## 2013-02-15 MED ORDER — PHENYLEPHRINE HCL 10 MG/ML IJ SOLN
INTRAMUSCULAR | Status: DC | PRN
  Administered 2013-02-15: 160 ug via INTRAVENOUS
  Administered 2013-02-15: 40 ug via INTRAVENOUS
  Administered 2013-02-15: 200 ug via INTRAVENOUS

## 2013-02-15 MED ORDER — DIPHENHYDRAMINE HCL 50 MG/ML IJ SOLN: 12.5000 mg | Freq: Four times a day (QID) | INTRAMUSCULAR | Status: DC | PRN

## 2013-02-15 MED ORDER — FENTANYL CITRATE 0.05 MG/ML IJ SOLN
INTRAMUSCULAR | Status: AC
  Filled 2013-02-15: qty 2

## 2013-02-15 MED ORDER — HYDROMORPHONE HCL PF 1 MG/ML IJ SOLN
INTRAMUSCULAR | Status: AC
  Filled 2013-02-15: qty 1

## 2013-02-15 MED ORDER — KETOROLAC TROMETHAMINE 30 MG/ML IJ SOLN
INTRAMUSCULAR | Status: AC
  Filled 2013-02-15: qty 1

## 2013-02-15 MED ORDER — HYDROMORPHONE HCL PF 1 MG/ML IJ SOLN
0.2500 mg | INTRAMUSCULAR | Status: DC | PRN
  Administered 2013-02-15 (×2): 0.5 mg via INTRAVENOUS

## 2013-02-15 MED ORDER — ONDANSETRON HCL 4 MG/2ML IJ SOLN
4.0000 mg | Freq: Four times a day (QID) | INTRAMUSCULAR | Status: DC | PRN
  Administered 2013-02-15 – 2013-02-16 (×2): 4 mg via INTRAVENOUS
  Filled 2013-02-15 (×2): qty 2

## 2013-02-15 MED ORDER — HYDROMORPHONE 0.3 MG/ML IV SOLN
INTRAVENOUS | Status: DC
  Administered 2013-02-15: 11:00:00 via INTRAVENOUS
  Administered 2013-02-15: 0.999 mg via INTRAVENOUS
  Administered 2013-02-15: 0.599 mg via INTRAVENOUS
  Administered 2013-02-16: 2.59 mg via INTRAVENOUS
  Administered 2013-02-16: 0.6 mg via INTRAVENOUS
  Filled 2013-02-15: qty 25

## 2013-02-15 MED ORDER — INDIGOTINDISULFONATE SODIUM 8 MG/ML IJ SOLN
INTRAMUSCULAR | Status: AC
  Filled 2013-02-15: qty 5

## 2013-02-15 MED ORDER — ONDANSETRON HCL 4 MG/2ML IJ SOLN
INTRAMUSCULAR | Status: DC | PRN
  Administered 2013-02-15: 4 mg via INTRAVENOUS

## 2013-02-15 MED ORDER — PROPOFOL 10 MG/ML IV EMUL
INTRAVENOUS | Status: DC | PRN
  Administered 2013-02-15: 200 mg via INTRAVENOUS

## 2013-02-15 MED ORDER — NEOSTIGMINE METHYLSULFATE 1 MG/ML IJ SOLN
INTRAMUSCULAR | Status: DC | PRN
  Administered 2013-02-15: 3 mg via INTRAVENOUS

## 2013-02-15 MED ORDER — METOCLOPRAMIDE HCL 5 MG/ML IJ SOLN: 10.0000 mg | Freq: Once | INTRAMUSCULAR | Status: DC | PRN

## 2013-02-15 MED ORDER — HYDROMORPHONE HCL PF 1 MG/ML IJ SOLN
INTRAMUSCULAR | Status: AC
  Administered 2013-02-15: 0.5 mg via INTRAVENOUS
  Filled 2013-02-15: qty 1

## 2013-02-15 MED ORDER — ROCURONIUM BROMIDE 50 MG/5ML IV SOLN
INTRAVENOUS | Status: AC
  Filled 2013-02-15: qty 1

## 2013-02-15 MED ORDER — LIDOCAINE HCL (CARDIAC) 20 MG/ML IV SOLN
INTRAVENOUS | Status: DC | PRN
  Administered 2013-02-15 (×2): 30 mg via INTRAVENOUS

## 2013-02-15 MED ORDER — NALOXONE HCL 0.4 MG/ML IJ SOLN: 0.4000 mg | INTRAMUSCULAR | Status: DC | PRN

## 2013-02-15 MED ORDER — MIDAZOLAM HCL 2 MG/2ML IJ SOLN
INTRAMUSCULAR | Status: AC
  Filled 2013-02-15: qty 2

## 2013-02-15 SURGICAL SUPPLY — 55 items
ADH SKN CLS APL DERMABOND .7 (GAUZE/BANDAGES/DRESSINGS) ×1
BLADE SURG 15 STRL LF C SS BP (BLADE) ×1 IMPLANT
BLADE SURG 15 STRL SS (BLADE) ×2
CABLE HIGH FREQUENCY MONO STRZ (ELECTRODE) IMPLANT
CLOTH BEACON ORANGE TIMEOUT ST (SAFETY) ×2 IMPLANT
CONT PATH 16OZ SNAP LID 3702 (MISCELLANEOUS) IMPLANT
COVER MAYO STAND STRL (DRAPES) ×2 IMPLANT
COVER TABLE BACK 60X90 (DRAPES) ×2 IMPLANT
DERMABOND ADVANCED (GAUZE/BANDAGES/DRESSINGS) ×1
DERMABOND ADVANCED .7 DNX12 (GAUZE/BANDAGES/DRESSINGS) ×1 IMPLANT
DEVICE SUTURE ENDOST 10MM (ENDOMECHANICALS) IMPLANT
DISSECTOR BLUNT TIP ENDO 5MM (MISCELLANEOUS) IMPLANT
ENDOSTITCH 0 SINGLE 48 (SUTURE) IMPLANT
EVACUATOR SMOKE 8.L (FILTER) ×2 IMPLANT
GLOVE BIO SURGEON STRL SZ7.5 (GLOVE) ×2 IMPLANT
GLOVE BIOGEL PI IND STRL 6.5 (GLOVE) IMPLANT
GLOVE BIOGEL PI IND STRL 8 (GLOVE) ×1 IMPLANT
GLOVE BIOGEL PI INDICATOR 6.5 (GLOVE)
GLOVE BIOGEL PI INDICATOR 8 (GLOVE) ×1
GLOVE ECLIPSE 7.5 STRL STRAW (GLOVE) ×5 IMPLANT
GOWN PREVENTION PLUS LG XLONG (DISPOSABLE) ×4 IMPLANT
NS IRRIG 1000ML POUR BTL (IV SOLUTION) ×2 IMPLANT
OCCLUDER COLPOPNEUMO (BALLOONS) IMPLANT
PACK LAVH (CUSTOM PROCEDURE TRAY) ×2 IMPLANT
SCALPEL HARMONIC ACE (MISCELLANEOUS) ×2 IMPLANT
SCISSORS LAP 5X35 DISP (ENDOMECHANICALS) IMPLANT
SET CYSTO W/LG BORE CLAMP LF (SET/KITS/TRAYS/PACK) IMPLANT
SET IRRIG TUBING LAPAROSCOPIC (IRRIGATION / IRRIGATOR) ×2 IMPLANT
SUT VIC AB 0 CT1 18XCR BRD8 (SUTURE) IMPLANT
SUT VIC AB 0 CT1 27 (SUTURE) ×2
SUT VIC AB 0 CT1 27XBRD ANBCTR (SUTURE) IMPLANT
SUT VIC AB 0 CT1 8-18 (SUTURE) ×2
SUT VIC AB 2-0 SH 27 (SUTURE)
SUT VIC AB 2-0 SH 27XBRD (SUTURE) IMPLANT
SUT VIC AB 3-0 CT1 27 (SUTURE)
SUT VIC AB 3-0 CT1 TAPERPNT 27 (SUTURE) IMPLANT
SUT VIC AB 3-0 SH 27 (SUTURE)
SUT VIC AB 3-0 SH 27X BRD (SUTURE) IMPLANT
SUT VICRYL 0 TIES 12 18 (SUTURE) IMPLANT
SUT VICRYL 0 UR6 27IN ABS (SUTURE) ×2 IMPLANT
SUT VICRYL RAPIDE 3 0 (SUTURE) ×4 IMPLANT
SYR 50ML LL SCALE MARK (SYRINGE) ×2 IMPLANT
SYR BULB IRRIGATION 50ML (SYRINGE) ×2 IMPLANT
TIP UTERINE 5.1X6CM LAV DISP (MISCELLANEOUS) IMPLANT
TIP UTERINE 6.7X10CM GRN DISP (MISCELLANEOUS) ×1 IMPLANT
TIP UTERINE 6.7X6CM WHT DISP (MISCELLANEOUS) IMPLANT
TIP UTERINE 6.7X8CM BLUE DISP (MISCELLANEOUS) IMPLANT
TOWEL OR 17X24 6PK STRL BLUE (TOWEL DISPOSABLE) ×4 IMPLANT
TRAY FOLEY CATH 14FR (SET/KITS/TRAYS/PACK) ×2 IMPLANT
TROCAR BALLN 12MMX100 BLUNT (TROCAR) IMPLANT
TROCAR BLADELESS OPT 12M 150M (ENDOMECHANICALS) ×1 IMPLANT
TROCAR XCEL NON-BLD 11X100MML (ENDOMECHANICALS) ×4 IMPLANT
TROCAR XCEL NON-BLD 5MMX100MML (ENDOMECHANICALS) ×2 IMPLANT
WARMER LAPAROSCOPE (MISCELLANEOUS) ×2 IMPLANT
WATER STERILE IRR 1000ML POUR (IV SOLUTION) ×2 IMPLANT

## 2013-02-15 NOTE — Interval H&P Note (Signed)
History and Physical Interval Note:  02/15/2013 7:02 AM  Whitney Schultz  has presented today for surgery, with the diagnosis of dysfunctional uterine bleeding, anemia  The various methods of treatment have been discussed with the patient and family. After consideration of risks, benefits and other options for treatment, the patient has consented to  Procedure(s) with comments: HYSTERECTOMY TOTAL LAPAROSCOPIC (N/A) - CPT [phone]  Dr. Colin Broach to assist. as a surgical intervention .  The patient's history has been reviewed, patient examined, no change in status, stable for surgery.  I have reviewed the patient's chart and labs.  Questions were answered to the patient's satisfaction.     Ok Edwards

## 2013-02-15 NOTE — Transfer of Care (Signed)
Immediate Anesthesia Transfer of Care Note  Patient: Whitney Schultz  Procedure(s) Performed: Procedure(s) with comments: HYSTERECTOMY TOTAL LAPAROSCOPIC (N/A) - CPT [phone]  Dr. Colin Broach to assist.  Patient Location: PACU  Anesthesia Type:General  Level of Consciousness: awake, sedated and responds to stimulation  Airway & Oxygen Therapy: Patient Spontanous Breathing and Patient connected to nasal cannula oxygen  Post-op Assessment: Report given to PACU RN  Post vital signs: Reviewed and stable  Complications: No apparent anesthesia complications

## 2013-02-15 NOTE — Anesthesia Postprocedure Evaluation (Signed)
  Anesthesia Post-op Note  Patient: Whitney Schultz  Procedure(s) Performed: Procedure(s) with comments: HYSTERECTOMY TOTAL LAPAROSCOPIC (N/A) - CPT [phone]  Dr. Colin Broach to assist.  Patient Location: PACU  Anesthesia Type:General  Level of Consciousness: awake, alert  and oriented  Airway and Oxygen Therapy: Patient Spontanous Breathing  Post-op Pain: mild  Post-op Assessment: Post-op Vital signs reviewed, Patient's Cardiovascular Status Stable, Respiratory Function Stable, Patent Airway, No signs of Nausea or vomiting and Pain level controlled  Post-op Vital Signs: Reviewed and stable  Complications: No apparent anesthesia complications

## 2013-02-15 NOTE — Anesthesia Preprocedure Evaluation (Signed)
Anesthesia Evaluation  Patient identified by MRN, date of birth, ID band Patient awake    Reviewed: Allergy & Precautions, H&P , NPO status , Patient's Chart, lab work & pertinent test results, reviewed documented beta blocker date and time   History of Anesthesia Complications Negative for: history of anesthetic complications  Airway Mallampati: IV TM Distance: <3 FB Neck ROM: full    Dental  (+) Teeth Intact   Pulmonary neg pulmonary ROS,  breath sounds clear to auscultation  Pulmonary exam normal       Cardiovascular Exercise Tolerance: Good negative cardio ROS  Rhythm:regular Rate:Normal     Neuro/Psych  Headaches (frequent headaches.  migraines - none in 5 months), negative psych ROS   GI/Hepatic Neg liver ROS, GERD-  Medicated and Controlled,  Endo/Other  Morbid obesity  Renal/GU negative Renal ROS  Female GU complaint     Musculoskeletal   Abdominal   Peds  Hematology  (+) anemia ,   Anesthesia Other Findings   Reproductive/Obstetrics negative OB ROS                           Anesthesia Physical Anesthesia Plan  ASA: III  Anesthesia Plan: General ETT   Post-op Pain Management:    Induction:   Airway Management Planned:   Additional Equipment:   Intra-op Plan:   Post-operative Plan:   Informed Consent: I have reviewed the patients History and Physical, chart, labs and discussed the procedure including the risks, benefits and alternatives for the proposed anesthesia with the patient or authorized representative who has indicated his/her understanding and acceptance.   Dental Advisory Given  Plan Discussed with: CRNA and Surgeon  Anesthesia Plan Comments:         Anesthesia Quick Evaluation

## 2013-02-15 NOTE — H&P (View-Only) (Signed)
Whitney Schultz is an 37 y.o. female who presented to the office today for preoperative consultation. Patient is a gravida 2 para 2  (2 cesarean sections)who presented to the office today to further discuss her medical management for dysfunction uterine bleeding. Patient's main complaint that for the past 5 months she continues to have intermenstrual bleeding. She also has complained of postcoital bleeding as well. She denied any pain. Patient with prior tubal ligation. Patient was noted to have anemia with a hemoglobin of 10.4 for she's currently on iron 3 times a day. Her last CBC demonstrated her hemoglobin was up to 11.4. Patient had an endometrial biopsy done in the office if everything 2014 with the following result:  Endometrium, biopsy  - BENIGN SECRETORY ENDOMETRIUM, NO ATYPIA, HYPERPLASIA OR MALIGNANCY.  Patient's ultrasound on March 10 with the following result: Uterus measured 8.7 x 5.6 x 4.5 cm with endometrial stripe of 17.8 mm. Patient's last menses was first week in February. Endometrium was prominent. Right ovary left ovary were normal with several follicles. Sono infusion histogram demonstrated a thickened endometrium anterior wall 5.1 mm and posterior wall 8.3 mm.  Patient been currently on Megace 40 mg twice a day along with iron 3 times a day. Patient scheduled to undergo a total laparoscopic hysterectomy with ovarian conservation.  Pertinent Gynecological History: Menses: flow is excessive with use of 4-5 pads or tampons on heaviest days Bleeding: see above Contraception: tubal ligation DES exposure: denies Blood transfusions: none Sexually transmitted diseases: no past history Previous GYN Procedures: 2 cesarean sections  Last mammogram: Not indicated Date: not indicated Last pap: normal Date: 2014 OB History: G2, P2   Menstrual History: Menarche age: 11  Patient's last menstrual period was 01/25/2013.    Past Medical History  Diagnosis Date  . Anemia   .  Migraines   . Insomnia   . GERD (gastroesophageal reflux disease)     Past Surgical History  Procedure Laterality Date  . Cesarean section  1998,2010    x2 with BTSP  . Lithotripsy  2000    Family History  Problem Relation Age of Onset  . Hypertension Mother   . Diabetes Maternal Grandmother   . Diabetes Paternal Grandfather     Social History:  reports that she has never smoked. She has never used smokeless tobacco. She reports that she does not drink alcohol or use illicit drugs.  Allergies:  Allergies  Allergen Reactions  . Prednisone Rash     (Not in a hospital admission)  REVIEW OF SYSTEMS: A ROS was performed and pertinent positives and negatives are included in the history.  GENERAL: No fevers or chills. HEENT: No change in vision, no earache, sore throat or sinus congestion. NECK: No pain or stiffness. CARDIOVASCULAR: No chest pain or pressure. No palpitations. PULMONARY: No shortness of breath, cough or wheeze. GASTROINTESTINAL: No abdominal pain, nausea, vomiting or diarrhea, melena or bright red blood per rectum. GENITOURINARY: No urinary frequency, urgency, hesitancy or dysuria. MUSCULOSKELETAL: No joint or muscle pain, no back pain, no recent trauma. DERMATOLOGIC: No rash, no itching, no lesions. ENDOCRINE: No polyuria, polydipsia, no heat or cold intolerance. No recent change in weight. HEMATOLOGICAL: No anemia or easy bruising or bleeding. NEUROLOGIC: No headache, seizures, numbness, tingling or weakness. PSYCHIATRIC: No depression, no loss of interest in normal activity or change in sleep pattern.     Blood pressure 126/78, last menstrual period 01/25/2013.  Physical Exam:  HEENT:unremarkable Neck:Supple, midline, no thyroid megaly, no carotid bruits Lungs:    Clear to auscultation no rhonchi's or wheezes Heart:Regular rate and rhythm, no murmurs or gallops Breast Exam:during time of annual exam was normal  Abdomen:soft nontender no rebound or  guarding Pelvic:BUSwithin normal limits Vagina:dark brown vaginal blood noted Cervix:no lesions or discharge Uterus:anteverted normal size shape and consistency Adnexa:no palpable masses or tenderness Extremities: No cords, no edema Rectal:unremarkable  Results for orders placed during the hospital encounter of 02/03/13 (from the past 24 hour(s))  SURGICAL PCR SCREEN     Status: None   Collection Time    02/03/13  9:50 AM      Result Value Range   MRSA, PCR NEGATIVE  NEGATIVE   Staphylococcus aureus NEGATIVE  NEGATIVE  CBC     Status: Abnormal   Collection Time    02/03/13 10:10 AM      Result Value Range   WBC 11.3 (*) 4.0 - 10.5 K/uL   RBC 5.15 (*) 3.87 - 5.11 MIL/uL   Hemoglobin 11.4 (*) 12.0 - 15.0 g/dL   HCT 36.3  36.0 - 46.0 %   MCV 70.5 (*) 78.0 - 100.0 fL   MCH 22.1 (*) 26.0 - 34.0 pg   MCHC 31.4  30.0 - 36.0 g/dL   RDW 19.5 (*) 11.5 - 15.5 %   Platelets 375  150 - 400 K/uL  URINALYSIS, ROUTINE W REFLEX MICROSCOPIC     Status: Abnormal   Collection Time    02/03/13 10:10 AM      Result Value Range   Color, Urine YELLOW  YELLOW   APPearance CLOUDY (*) CLEAR   Specific Gravity, Urine 1.025  1.005 - 1.030   pH 6.0  5.0 - 8.0   Glucose, UA NEGATIVE  NEGATIVE mg/dL   Hgb urine dipstick LARGE (*) NEGATIVE   Bilirubin Urine NEGATIVE  NEGATIVE   Ketones, ur NEGATIVE  NEGATIVE mg/dL   Protein, ur NEGATIVE  NEGATIVE mg/dL   Urobilinogen, UA 0.2  0.0 - 1.0 mg/dL   Nitrite POSITIVE (*) NEGATIVE   Leukocytes, UA TRACE (*) NEGATIVE  URINE MICROSCOPIC-ADD ON     Status: Abnormal   Collection Time    02/03/13 10:10 AM      Result Value Range   Squamous Epithelial / LPF FEW (*) RARE   WBC, UA 3-6  <3 WBC/hpf   RBC / HPF 0-2  <3 RBC/hpf   Bacteria, UA MANY (*) RARE    No results found.  Assessment/Plan: Patient scheduled to undergo total laparoscopic hysterectomy with ovarian conservation as a result of her refractory menorrhagia despite various treatment options had  been provided. Patient has had 2 previous cesarean sections. The following risk were discussed with the patient as well as literature information provided:                        Patient was counseled as to the risk of surgery to include the following:  1. Infection (prohylactic antibiotics will be administered)  2. DVT/Pulmonary Embolism (prophylactic pneumo compression stockings will be used)  3.Trauma to internal organs requiring additional surgical procedure to repair any injury to     Internal organs requiring perhaps additional hospitalization days.  4.Hemmorhage requiring transfusion and blood products which carry risks such as  anaphylactic reaction, hepatitis and AIDS  Patient had received literature information on the procedure scheduled and all her questions were answered and fully accepts all risk.  Patient will continue her iron tablet 1 by mouth 3 times a day until the day   prior to her surgery as well as her Megace 40 mg twice a day.  FERNANDEZ,JUAN HMD12:12 PMTD@  FERNANDEZ,JUAN H 02/03/2013, 12:05 PM  

## 2013-02-15 NOTE — Progress Notes (Signed)
Subjective: Patient reports tolerating PO.    Objective: I have reviewed patient's vital signs, intake and output and medications.  Filed Vitals:   02/15/13 1801  BP: 129/71  Pulse: 75  Temp: 98.1 F (36.7 C)  Resp: 17      Total I/O In: 3345.8 [P.O.:600; I.V.:2745.8] Out: 440 [Urine:290; Blood:150]  Assessment/Plan: 0 Patient alert and comfortable with Dilaudid PCA. Urine output average 75 cc's clear. Patient having a mild headache. Abdominal dressings dry. Will advance diet. Has ambulated. Will d/c  PCA Pump in the am and check her CBC.   Ok Edwards, MD 6:07 PM 02/15/2013

## 2013-02-15 NOTE — Anesthesia Postprocedure Evaluation (Signed)
Anesthesia Post Note  Patient: Whitney Schultz  Procedure(s) Performed: Procedure(s) (LRB): HYSTERECTOMY TOTAL LAPAROSCOPIC (N/A)  Anesthesia type: General  Patient location: [facility]  Post pain: Pain level controlled  Post assessment: Post-op Vital signs reviewed  Last Vitals:  Filed Vitals:   02/15/13 1345  BP: 114/74  Pulse: 82  Temp: 36.8 C  Resp: 18    Post vital signs: Reviewed  Level of consciousness: sedated  Complications: No apparent anesthesia complications

## 2013-02-15 NOTE — Op Note (Signed)
02/15/2013  9:42 AM  PATIENT:  Whitney Schultz  37 y.o. female  PRE-OPERATIVE DIAGNOSIS:  dysfunctional uterine bleeding, anemia  POST-OPERATIVE DIAGNOSIS:  dysfunctional uterine bleeding, anemia  PROCEDURE:  Procedure(s): HYSTERECTOMY TOTAL LAPAROSCOPIC  SURGEON:  Surgeon(s): Ok Edwards, MD Dara Lords, MD  ANESTHESIA:   general  FINDINGS: 10-12 week size uterus. Evidence of previous tubal sterilization procedure. Normal-appearing tubes and ovaries. Appendix not visualized. Liver not visualized.  DESCRIPTION OF OPERATION:The patient was taken to the operating room where she underwent successful general endotracheal anesthesia. A time out procedure was performed prior to start time and agreed upon by all OR Staff. Confirmation of the correct patient, procedure and site were established. The patient was then positioned in the laparoscopic lithotomy position, with aseptic prepping and draping.  After dilating the cervix to Hegar 8, the uterus was sounded to measure its length, and then a RUMI disposable tip less than or equal to the length that was sounded was used (10). The KOH Cup was selected based on the size of the cervix and ascertaining that it covered it (3-1/2). After connecting the disposable tip to the RUMI uterine manipulator,the pneumo-occluder was placed over the tip and shaft of the RUMI, and then fit the KOH cup over the tip. Using lateral and upper and lower vaginal retractors,  a tenaculum was placed on the anterior cervix lip and pulled it down. Next, surgical gel was applied to the RUMI tip and inserted it in the cervical os. The posterior retractor was removed, as well as the anterior one. With sidewall retractors in place, the tip was inserted into the uterus and the cup placed in the vagina. . At this point, while releasing the first tenaculum, I introduced a second tenaculum through the fenestration of the cup to catch the cervix. I then pushed the RUMI tip  further into the uterus to engage the cup around the cervix and against the fornix by pushing the RUMI cephalad, with countertraction provided by the second tenaculum.I then inflated the uterine balloon with 5 cc of water, and then inserted a three-way Foley catheter for bladder drainage. Later during the case the pneumo-occluder was instilled with 60 cc of saline .   The abdomen was previously prepped and draped in the usual sterile fashion. A small subumbilical incision was made and a 10/11 mm Optiview trocar was introduced into the abdominal cavity. A pneumoperitoneum was established with 2 to 3 L of CO2. The patient was then placed in Trendelenburg position. Two additional ports were introduced under laparoscopic guidance with 5 mm trochars at the patient's right and left lower abdomen respectively.  To ensure that I had placed the colpotomy system correctly, I anteflexed and retroflexed the RUMI tip and palpated the rim of the cup with a grasper, visually identifying the bulge.With the use of the harmonic scalpel I first coaptated and transected the tubo-ovarian pedicle. I then coaptated and transected round ligament, and opened the parametrium bilaterally. To highlight the anterior fornix, I pushed the cup up firmly. At the level of the fornix, I incised the uterovesical peritoneum and dissected the bladder down to expose 1 cm to 2 cm of the anterior vagina. I then incised the anterior fornix, using the backside blade of the Harmonic Scalpel. Entry into the vagina was confirmed by visualizing the cup. Next, I extended the incision laterally, stopping short of the uterine vessels. Bipolar dissection was used to control cuff bleeders. The RUMI uterine manipulator was pushed cephalad stretching the  vagina and thus distancing the incision from the ureter.               Next, after rotating the handle of the RUMI clockwise, I anteflexed the uterus acutely. I made the posterior colpotomy incision at the rim of  the cup I then moved the RUMI handle to expose the right vaginal fornix and uterine vessels, all the while maintaining good pressure on the RUMI manipulator. With the bipolar Kleppinger forceps the right uterine vessels above the cup rim were cauterized. I then applied the Harmonic Scalpel and coaptated and transecedt the uterine vein and artery. I then rotated the uterus to the right pelvis and proceeded to cauterize with the Kleppinger forcep the left uterine vessels. Following this the Harmonic Scalpel was utilized to coaptate and transect the uterine artery and vein.  To create   distance from the ureters, I pushed the cup up against the fornix, thereby lengthening the vagina and pushing the vessels upward.  I also divided  the lateral vaginal fornix on the left and right sides to complete the colpotomy incision with the backside of the Harmonic Scalpel blade. During this critical stage of the operation, pneumoperitoneum is maintained by the pneumo-occluder. once the complete colpotomy had been completed  the pneumo-occluder is deflated and a gentle pull on the RUMI handle was undertaken to deliver the uterus from the vagina and submitted for histological evaluation. We proceeded from this point to close the vaginal cuff vaginally. A weighted speculum was placed in the posterior vaginal vault. Allis clamps were utilized to identify the vaginal edges. The vaginal cuff was then closed with interrupted sutures of figure-of-eight of 0 Vicryl suture. The vagina was copiously irrigated with normal saline solution. We went again back up top and reestablished pneumoperitoneum and laparoscopically inspected the vaginal cuff which was intact with no bleeding noted in all pedicles were dry. The pneumoperitoneum was then released.  The subumbilical fascia was closed with an interrupted suture 0 Vicryl suture. The subcutaneous tissue was reapproximated with 3-0 Vicryl suture and the skin edges were reapproximated with  Dermabond glue. Both 5 mm ports were reapproximated with Dermabond glue. A quarter percent Marcaine was infiltrated in all 3 port sites for postoperative analgesia for a total of 10 cc. The patient was reversed from Trendelenburg position was extubated and transferred to recovery room stable vital signs.        ESTIMATED BLOOD LOSS: 150 cc  Intake/Output Summary (Last 24 hours) at 02/15/13 0942 Last data filed at 02/15/13 0920  Gross per 24 hour  Intake   1800 ml  Output    225 ml  Net   1575 ml     BLOOD ADMINISTERED:none   LOCAL MEDICATIONS USED:  MARCAINE   10 cc subcutaneous incision ports   SPECIMEN:  Source of Specimen:  Uterus and cervix  DISPOSITION OF SPECIMEN:  PATHOLOGY  COUNTS:  YES  PLAN OF CARE: Transfer to PACU  [facility] HMD9:42 AMTD@

## 2013-02-16 ENCOUNTER — Encounter (HOSPITAL_COMMUNITY): Payer: Self-pay | Admitting: Gynecology

## 2013-02-16 LAB — CBC
MCH: 22.6 pg — ABNORMAL LOW (ref 26.0–34.0)
MCHC: 31.2 g/dL (ref 30.0–36.0)
Platelets: 265 10*3/uL (ref 150–400)
RBC: 4.03 MIL/uL (ref 3.87–5.11)

## 2013-02-16 NOTE — Discharge Summary (Signed)
Physician Discharge Summary  Patient ID: Whitney Schultz MRN: [id number] DOB/AGE: September 11, 1976 37 y.o.  Admit date: 02/15/2013 Discharge date: 02/16/2013  Admission Diagnoses: Dysmenorrhea, menorrhagia, anemia  Discharge Diagnoses: Dysmenorrhea, menorrhagia, anemia   Discharged Condition: good  Hospital Course: Patient was admitted to the hospital on Monday, 02/15/2013 where she underwent a total laparoscopic hysterectomy with ovarian conservation as a result of her dysmenorrhea, menorrhagia, and anemia. Patient's preop hemoglobin was 11.4 and postop this morning was 9.1. Last p.m. patient's Foley catheter was removed she had an average of 75 cc per hour and has voided preterm spontaneously since last night. She has gotten up to ambulate several times to the bathroom. She was started on a  clear liquid diet and has slowly been advanced to regular diet. She has remained normotensive and afebrile. This morning her blood pressure was 121/62 pulse rate 70.   Consults: None  Significant Diagnostic Studies: labs: Hemoglobin 9.1  Treatments: IV hydration  Discharge Exam: Blood pressure 97/53, pulse 70, temperature 97.7 F (36.5 C), temperature source Oral, resp. rate 18, height 5\' 4"  (1.626 m), weight 270 lb (122.471 kg), SpO2 99.00%. General appearance: alert and cooperative Resp: clear to auscultation bilaterally Cardio: regular rate and rhythm, S1, S2 normal, no murmur, click, rub or gallop GI: soft, non-tender; bowel sounds normal; no masses,  no organomegaly and Incision ports intact Extremities: extremities normal, atraumatic, no cyanosis or edema  Disposition:   Discharge Orders   Future Appointments Provider Department Dept Phone   03/02/2013 11:20 AM Ok Edwards, MD [facility] [phone]   11/16/2013 9:00 AM Ok Edwards, MD [facility] [phone]   Future Orders Complete By Expires     Call MD for:  redness, tenderness, or  signs of infection (pain, swelling, bleeding, redness, odor or green/yellow discharge around incision site)  As directed     Call MD for:  severe or increased pain, loss or decreased feeling  in affected limb(s)  As directed     Call MD for:  temperature >100.5  As directed     Discharge instructions  As directed     Scheduling Instructions:      Histerectoma abdominal Cuidados posteriores a la ciruga (Hysterectomy, Abdominal, Care After) Por favor lea detenidamente las siguientes indicaciones. Sgalas durante las prximas semanas. Estas indicaciones le proporcionan informacin general acerca de cmo deber cuidarse despus de la ciruga. El mdico podr darle instrucciones especficas. Aunque el tratamiento se ha planificado de acuerdo con las prcticas mdicas disponibles ms recientes, ocasionalmente pueden ocurrir complicaciones inevitables. Si tiene problemas o surgen preguntas luego de recibir el alta, por favor comunquese con su mdico. INSTRUCCIONES PARA EL CUIDADO DOMICILIARIO La curacin puede demorar algn tiempo. Puede sentir molestias, sensibilidad, hinchazn y hematomas en el sitio de la operacin, durante algunas semanas. Esto es normal y Scientist, clinical (histocompatibility and immunogenetics) a medida que pase el [place].  Slo tome medicamentos de Sales promotion account executive o prescriptos para Primary school teacher, las molestias o bajar la fiebre segn las indicaciones de su mdico.  No tome aspirina. Esta puede ocasionar hemorragias.  No conduzca mientras toma analgsicos.  Siga las indicaciones de su mdico con respecto a la dieta, la actividad fsica, a levantar objetos, conducir el automvil y para las actividades en general.  Reanude su dieta habitual, segn las indicaciones y permisos.  Descanse y duerma lo suficiente.  No utilice tampones, duchas vaginales ni tenga relaciones sexuales hasta que el profesional la autorice.  Cambie el vendaje tal como se le indic.  Tmese la [employer] por da. Antela.  El mdico podr  indicarle que se duche y no se d baos de inmersin durante Armed forces technical officer.  No beba alcohol hasta que el mdico la autorice.  Si est constipada, tome un laxante suave, si el mdico la autoriza. Los lquidos y los alimentos que contengan salvado la ayudarn para el problema de la constipacin.  Trate de que alguien la acompae en su casa durante una o [place], para ayudarla con los quehaceres domsticos.  Asegrese que usted y su familia comprenden todo acerca de su operacin y Research scientist (physical sciences).Marland Kitchen  No firme ningn documento legal hasta que se sienta completamente bien.  Cumpla con todas las visitas de control, segn le indique su mdico.  SOLICITE ATENCIN MDICA SI: Presenta enrojecimiento, hinchazn o aumento del dolor en la herida.  Aparece pus en la herida.  Advierte un olor ftido que proviene de la herida o del vendaje.  Siente dolor u observa enrojecimiento e hinchazn en el sitio de la va intravenosa.  La herida se abre (los bordes no estn unidos)  Se siente mareada o sufre un desmayo.  Siente dolor o tiene una hemorragia al [employer].  Tiene diarrea.  Presenta nuseas o vmitos.  Brett Fairy hemorragia vaginal anormal.  Aparece una erupcin cutnea.  Tiene alguna reaccin anormal o aparece una alergia por los medicamentos.  Necesita analgsicos ms fuertes para su dolor.  SOLICITE ATENCIN MDICA DE INMEDIATO SI: La temperatura se eleva por encima de 100.  Siente dolor abdominal.  Siente dolor en el pecho.  Le falta el aire.  Se desmaya.  Siente dolor, u observa hinchazn o enrojecimiento en la pierna.  Tiene una hemorragia vaginal abundante, con o sin cogulos.  Document Released: 04/06/2007 Document Re-Released: 03/13/2010 [facility] Patient Information 2011 [place], [place].   MEmacular degenerationNOW5/13/2014    Comments:      Postop appointment with Dr. Lily Peer in 2 weeks as previously made    Driving Restrictions  As directed     Comments:      No driving for  1 weeks    Lifting restrictions  As directed     Comments:      No lifting for 6 weeks    Resume previous diet  As directed         Medication List    STOP taking these medications       ciprofloxacin 250 MG tablet  Commonly known as:  CIPRO     megestrol 20 MG tablet  Commonly known as:  MEGACE      TAKE these medications       acetaminophen 500 MG tablet  Commonly known as:  TYLENOL  Take 1,000 mg by mouth every 6 (six) hours as needed for pain (headaches).     ferrous sulfate 325 (65 FE) MG tablet  Take 325 mg by mouth daily with breakfast.     HYDROcodone-acetaminophen 5-325 MG per tablet  Commonly known as:  NORCO/VICODIN  Take 1 tablet by mouth every 6 (six) hours as needed for pain.     loratadine 10 MG tablet  Commonly known as:  CLARITIN  Take 10 mg by mouth daily.     metoCLOPramide 10 MG tablet  Commonly known as:  REGLAN  Take 1 tablet (10 mg total) by mouth 3 (three) times daily with meals.     naproxen sodium 220 MG tablet  Commonly known as:  ANAPROX  Take 220 mg by mouth daily as needed (pain).  omeprazole 20 MG capsule  Commonly known as:  PRILOSEC  Take 40 mg by mouth daily.         SignedOk Edwards 02/16/2013, 6:26 AM

## 2013-03-02 ENCOUNTER — Ambulatory Visit (INDEPENDENT_AMBULATORY_CARE_PROVIDER_SITE_OTHER): Payer: Managed Care, Other (non HMO) | Admitting: Gynecology

## 2013-03-02 ENCOUNTER — Encounter: Payer: Self-pay | Admitting: Gynecology

## 2013-03-02 VITALS — BP 128/76

## 2013-03-02 DIAGNOSIS — Z09 Encounter for follow-up examination after completed treatment for conditions other than malignant neoplasm: Secondary | ICD-10-CM

## 2013-03-02 DIAGNOSIS — D649 Anemia, unspecified: Secondary | ICD-10-CM

## 2013-03-02 DIAGNOSIS — Z87440 Personal history of urinary (tract) infections: Secondary | ICD-10-CM

## 2013-03-02 LAB — CBC WITH DIFFERENTIAL/PLATELET
Basophils Absolute: 0.1 10*3/uL (ref 0.0–0.1)
Lymphocytes Relative: 31 % (ref 12–46)
Neutro Abs: 4.3 10*3/uL (ref 1.7–7.7)
Platelets: 404 10*3/uL — ABNORMAL HIGH (ref 150–400)
RDW: 21.7 % — ABNORMAL HIGH (ref 11.5–15.5)
WBC: 7.8 10*3/uL (ref 4.0–10.5)

## 2013-03-02 NOTE — Progress Notes (Signed)
Patient presented for her two-week postoperative visit. Patient status post total laparoscopic hysterectomy as a result of her symptomatic leiomyomatous uteri on 01/16/2013. Pathology report as follows:  Uterus and cervix - BENIGN ENDOMETRIAL POLYP AND ADJACENT BENIGN SECRETORY ENDOMETRIUM, NO ATYPIA, HYPERPLASIA OR MALIGNANCY. - CERVIX: BENIGN SQUAMOUS MUCOSA AND ENDOCERVICAL MUCOSA, NO DYSPLASIA OR MALIGNANCY. - UTERINE SEROSA: UNREMARKABLE.  Patient prior to her surgery her urinary tract infection was treated. She will leave a urine sample today to be tested. She does not have any dysuria or frequency. She has had some issues with constipation as a result of the narcotics that she had been taking for pain. Patient is otherwise doing well.  Exam: Abdomen soft nontender no rebound or guarding. Port sites intact. Pelvic exam: The urethra Skene glands within normal limits Vagina: Vaginal cuff intact suture material visible Bimanual exam: Abnormal masses or tenderness Rectal exam: Deferred  Assessment/plan: Patient status post total laparoscopic hysterectomy 2 weeks ago for symptomatic leiomyomatous uteri. Patient on discharge from the hospital had a hemoglobin of 9.1 and she's currently taking iron supplementation. This along with the narcotics that she was taking may have contributed to her constipation. If her hemoglobin returned back to normal she may stop theiron Supplementation. She'll begin taking Motrin by mouth when necessary. She will return back to the office in 4 weeks for final postop visit before she returns to work.

## 2013-03-03 LAB — URINALYSIS W MICROSCOPIC + REFLEX CULTURE
Crystals: NONE SEEN
Leukocytes, UA: NEGATIVE
Nitrite: NEGATIVE
Specific Gravity, Urine: >1.03 — ABNORMAL HIGH (ref 1.005–1.030)
Urobilinogen, UA: 0.2 mg/dL (ref 0.0–1.0)

## 2013-03-04 ENCOUNTER — Other Ambulatory Visit: Payer: Self-pay | Admitting: Gynecology

## 2013-03-04 MED ORDER — NITROFURANTOIN MONOHYD MACRO 100 MG PO CAPS: 100.0000 mg | ORAL_CAPSULE | Freq: Two times a day (BID) | ORAL | Status: DC

## 2013-03-22 ENCOUNTER — Ambulatory Visit: Payer: Managed Care, Other (non HMO) | Admitting: Gynecology

## 2013-03-31 ENCOUNTER — Encounter: Payer: Self-pay | Admitting: Gynecology

## 2013-03-31 ENCOUNTER — Ambulatory Visit (INDEPENDENT_AMBULATORY_CARE_PROVIDER_SITE_OTHER): Payer: Managed Care, Other (non HMO) | Admitting: Gynecology

## 2013-03-31 VITALS — BP 128/86

## 2013-03-31 DIAGNOSIS — Z09 Encounter for follow-up examination after completed treatment for conditions other than malignant neoplasm: Secondary | ICD-10-CM

## 2013-03-31 DIAGNOSIS — Z87440 Personal history of urinary (tract) infections: Secondary | ICD-10-CM

## 2013-03-31 LAB — URINALYSIS W MICROSCOPIC + REFLEX CULTURE
Glucose, UA: NEGATIVE mg/dL
Protein, ur: NEGATIVE mg/dL

## 2013-03-31 NOTE — Progress Notes (Signed)
Patient presents to the office for her six-week postop visit. Patient status post total laparoscopic hysterectomy 02/15/2013 as a result of symptomatic leiomyomatous uteri. Pathology report as follows:  Uterus and cervix  - BENIGN ENDOMETRIAL POLYP AND ADJACENT BENIGN SECRETORY ENDOMETRIUM, NO  ATYPIA, HYPERPLASIA OR MALIGNANCY.  - CERVIX: BENIGN SQUAMOUS MUCOSA AND ENDOCERVICAL MUCOSA, NO DYSPLASIA OR  MALIGNANCY.  - UTERINE SEROSA: UNREMARKABLE.  Patient is doing well. The patient on 3 prior visits to the office on February 14, April 14, and a 14 her urinalysis is demonstrated bacteria and upon completion of culture demonstrating Escherichia coli. She has been treated twice with Macrobid and once was Cipro. Today's urinalysis had many bacteria, 3-6 WBC, 0-2 RBC. She denies any dysuria or frequency but has informed me that in the year 2000 and she had a history of urethral stone that had to be removed because it was affecting her urination.  Exam: Back: No CVA tenderness Abdomen: Soft nontender no rebound or guarding. Port sites completely healed. Pelvic: And urethra Skene was within normal limits Vagina: Vaginal cuff intact Bimanual exam: No palpable mass or tenderness Rectal exam: Not done  Assessment/plan: Patient 6 weeks status post total laparoscopic hysterectomy doing well has resumed full normal activity. We'll await the results of today's urine culture and once again Escherichia coli is isolated she will be referred to the urologist for further assessment and based on her past history of stones. Patient will return back to the office in 1 year or when necessary.

## 2013-04-02 ENCOUNTER — Other Ambulatory Visit: Payer: Self-pay | Admitting: Gynecology

## 2013-04-02 LAB — URINE CULTURE

## 2013-04-02 MED ORDER — SULFAMETHOXAZOLE-TMP DS 800-160 MG PO TABS: 1.0000 | ORAL_TABLET | Freq: Two times a day (BID) | ORAL | Status: DC

## 2013-08-12 ENCOUNTER — Other Ambulatory Visit: Payer: Self-pay

## 2013-11-16 ENCOUNTER — Encounter: Payer: Managed Care, Other (non HMO) | Admitting: Gynecology

## 2013-12-15 ENCOUNTER — Encounter: Payer: Self-pay | Admitting: Gynecology

## 2014-05-26 ENCOUNTER — Ambulatory Visit (INDEPENDENT_AMBULATORY_CARE_PROVIDER_SITE_OTHER): Payer: Managed Care, Other (non HMO) | Admitting: Gynecology

## 2014-05-26 ENCOUNTER — Encounter: Payer: Self-pay | Admitting: Gynecology

## 2014-05-26 VITALS — BP 128/84 | Ht 64.75 in | Wt 270.0 lb

## 2014-05-26 DIAGNOSIS — Z01419 Encounter for gynecological examination (general) (routine) without abnormal findings: Secondary | ICD-10-CM

## 2014-05-26 LAB — LIPID PANEL
CHOL/HDL RATIO: 5 ratio
Cholesterol: 179 mg/dL (ref 0–200)
HDL: 36 mg/dL — ABNORMAL LOW (ref >39)
LDL Cholesterol: 116 mg/dL — ABNORMAL HIGH (ref 0–99)
TRIGLYCERIDES: 134 mg/dL (ref <150)
VLDL: 27 mg/dL (ref 0–40)

## 2014-05-26 LAB — CBC WITH DIFFERENTIAL/PLATELET
BASOS ABS: 0 10*3/uL (ref 0.0–0.1)
Basophils Relative: 0 % (ref 0–1)
EOS PCT: 2 % (ref 0–5)
Eosinophils Absolute: 0.2 10*3/uL (ref 0.0–0.7)
HEMATOCRIT: 39.5 % (ref 36.0–46.0)
HEMOGLOBIN: 13 g/dL (ref 12.0–15.0)
LYMPHS PCT: 29 % (ref 12–46)
Lymphs Abs: 2.5 10*3/uL (ref 0.7–4.0)
MCH: 26.9 pg (ref 26.0–34.0)
MCHC: 32.9 g/dL (ref 30.0–36.0)
MCV: 81.6 fL (ref 78.0–100.0)
MONO ABS: 0.4 10*3/uL (ref 0.1–1.0)
Monocytes Relative: 5 % (ref 3–12)
Neutro Abs: 5.5 10*3/uL (ref 1.7–7.7)
Neutrophils Relative %: 64 % (ref 43–77)
Platelets: 314 10*3/uL (ref 150–400)
RBC: 4.84 MIL/uL (ref 3.87–5.11)
RDW: 15.4 % (ref 11.5–15.5)
WBC: 8.6 10*3/uL (ref 4.0–10.5)

## 2014-05-26 LAB — COMPREHENSIVE METABOLIC PANEL
ALT: 26 U/L (ref 0–35)
AST: 18 U/L (ref 0–37)
Albumin: 3.7 g/dL (ref 3.5–5.2)
Alkaline Phosphatase: 81 U/L (ref 39–117)
BUN: 8 mg/dL (ref 6–23)
CALCIUM: 8.8 mg/dL (ref 8.4–10.5)
CO2: 23 meq/L (ref 19–32)
CREATININE: 0.75 mg/dL (ref 0.50–1.10)
Chloride: 102 mEq/L (ref 96–112)
Glucose, Bld: 87 mg/dL (ref 70–99)
Potassium: 3.9 mEq/L (ref 3.5–5.3)
Sodium: 136 mEq/L (ref 135–145)
Total Bilirubin: 0.3 mg/dL (ref 0.2–1.2)
Total Protein: 7 g/dL (ref 6.0–8.3)

## 2014-05-26 LAB — TSH: TSH: 3.342 u[IU]/mL (ref 0.350–4.500)

## 2014-05-26 NOTE — Patient Instructions (Signed)
Informacin sobre la ciruga baritrica (Bariatric Surgery Information) La obesidad grave es un trastorno crnico difcil de tratar slo con dieta y ejercicio. La ciruga baritrica (tambin llamada ciruga para la prdida de [place]) es la mejor opcin para las personas que presentan obesidad grave y no pueden perder peso con los mtodos tradicionales, o para quienes tienen problemas de salud relacionados con este trastorno. La ciruga promueve la prdida de peso al disminuir la absorcin de alimentos y en algunos casos, al interrumpir el proceso digestivo. Al igual que en el caso de otros tratamientos para la obesidad, los [employer] se logran con conductas saludables en materia de alimentacin y con la actividad fsica regular.  Las personas que pueden ser considerados para someterse a una ciruga baritrica son los que tienen un ndice de masa corporal [facility]) de ms de 40. Los hombres con un [facility] de 40 tienen un sobrepeso de alrededor de 100 lb (45 kg) y las mujeres con este [facility] tienen un sobrepeso de alrededor de 80 lb (36 kg). Las personas con un [facility] entre 35 y 40 y que padecen de diabetes tipo 2 o afecciones en el corazn y los pulmones (cardiopulmonares) que ponen en peligro la vida, como la apnea de sueo grave o las enfermedades cardacas relacionadas con la obesidad pueden tambin ser candidatos para [place].  EL PROCESO DIGESTIVO NORMAL Normalmente, a medida que la comida avanza por el tubo digestivo, los jugos digestivos y las enzimas digieren y absorben las caloras y los nutrientes. Despus de Product manager y tragar los alimentos, stos descienden por el esfago hasta el [place]. All, un cido fuerte contina el proceso digestivo. Cuando el contenido del estmago avanza hacia la primera porcin del intestino delgado (el duodeno) la bilis y el jugo pancretico aceleran la digestin. El leon y [place] yeyuno son los dos segmentos restantes del intestino delgado. Completan la absorcin de casi  todas las caloras y los nutrientes. Los restos de [employer] no pueden digerirse en el intestino delgado se Barrister's clerk en el intestino grueso hasta que se eliminan.  DE QU MODO LA CIRUGA FAVORECE LA PRDIDA DE PESO? La ciruga baritrica altera el proceso digestivo. La ciruga cierra partes del estmago para hacerlo ms pequeo, restringiendo la cantidad de alimento que el estmago puede Engineer, mining. Hay dos tipos de [place] baritrica: cirugas restrictivas y Passenger transport manager. Las cirugas restrictivas slo reducen el tamao del [place]. No interfieren con el proceso digestivo normal. Las cirugas malabsortivas combinan la restriccin del estmago con un bypass parcial del intestino delgado. Estos tipos de procedimientos crean una conexin directa desde el estmago a un segmento ms bajo del intestino delgado. La conexin hace que los alimentos no pasen por los segmentos del tracto digestivo que absorben las caloras y los nutrientes. Las operaciones malabsortivas son la forma de ciruga ms comn para la prdida de [place]. Restringen tanto la ingesta de comida como la cantidad de caloras y nutrientes que el organismo absorbe.  Las cirugas restrictivas producen la prdida de peso en casi todos los [place]quire. Pero tienen menos xito que las cirugas malabsortivas en el logro de una prdida de peso sustancial a Air cabin crew. Algunos pacientes vuelven a subir de [place]. Otros no pueden ajustar sus hbitos de alimentacin y no logran la prdida de peso deseada. El xito de los resultados depende de la disposicin de los pacientes a Chartered certified accountant un plan de largo plazo que incluya una alimentacin saludable y Doroteo Glassman fsica regular.  CIRUGA RESTRICTIVA Para realizar este tipo de [place]  mdico crea una pequea bolsa en la parte superior del estmago donde la comida ingresa desde el esfago. Al principio, la bolsa contiene alrededor de 1 onza (28 g) de alimento. Ms tarde se expande hasta contener 2-3 onzas  (56-84 g). El orificio inferior de la bolsa generalmente tiene un dimetro de apenas  de S[place] Pointpulgada. Este pequeo orificio demora el vaciado de los alimentos de la bolsa y Rodney Boozeocasiona una sensacin de saciedad. Como resultado de [place] and the Grenadinesesta ciruga, la mayor parte de las personas pierde la capacidad de comer grandes cantidades de comida por vez. Luego de la [place]ciruga, las personas por lo general slo pueden comer de  de taza a 1 taza de alimento sin sentir molestias o nuseas. Adems, los alimentos deben Golden West Financialmasticarse bien. Hay diferentes tipos de procedimiento para crear esta bolsa. Banda gstrica ajustable En este procedimiento, se coloca una banda hueca alrededor del Erskineestmago, cerca del extremo superior. Esto crea un pequeo bolsillo y un pasaje estrecho hacia la parte ms grande que ha Kyrgyz Republicquedado de estmago. Luego la banda se infla con una solucin salina. Eventualmente la banda puede apretarse o aflojarse para modificar el tamao del pasaje, aumentando o disminuyendo la cantidad de solucin salina. La banda se ajusta segn la sensacin de [place]ambre y la prdida de Los Mineralespeso. Los pacientes deciden cuando necesitan un ajuste y concurren al cirujano para que lo evale. El ajuste se realiza en el consultorio externo. La banda puede retirarse completamente en una segunda ciruga si el paciente cambia de opinin. No se corta ni se modifica el trayecto del intestino.  Gastroplastia vertical con banda Esta es la ciruga restrictiva ms frecuente para el control del peso. Utiliza de forma simultnea una banda y grapas para crear un pequeo bolsillo en Investment banker, corporateel estmago. La gastroplastia vertical con banda se basa en el mismo principio de restriccin que la banda gstrica ajustable, pero el estmago se modifica quirrgicamente con las grapas. Este tratamiento no es reversible.  Alrededor del 30% de las personas sometidas a una gastroplasta vertical con banda, llegan a su peso normal. Alrededor del 80% logran algn grado de prdida de  peso. CIRUGA MALABSORTIVA La ciruga malabsortiva produce mas prdida de peso que las cirugas restrictivas. Y son ms efectivas en la reversin de los problemas de salud asociados con la obesidad grave. Los Lyondell Chemicalpacientes que se han sometido a Passenger transport managercirugas malabsortivas habitualmente pierden AT&Tlos dos tercios del exceso de peso en el trmino de Boise Citydos aos. Hay varios tipos de Passenger transport managercirugas malabsortivas. Cada una de ellas tiene beneficios y riesgos que le son caractersticos.  Derivacin gstrica en Y de Roux (DGR) Esta operacin es la ms comn y Kazakhstanexitosa de las Passenger transport managercirugas malabsortivas. Primero, se crea en el estmago un pequeo bolsillo para restringir la ingesta de alimentos. Luego, se une al bolsillo una seccin del intestino delgado en forma de Y. Esto permite que los alimentos no pasen por la parte inferior del Ponchatoulaestmago, del duodeno y de la primera porcin del yeyuno. Este "bypass" o ruta alterna reduce la cantidad de caloras y nutrientes que el organismo absorbe.  Derivacin biliopancretica (DBP) Esta es una operacin malabsortiva ms complicada, en la que algunas porciones del estmago se extirpan. El pequeo bolsillo restante se conecta directamente al segmento final del intestino delgado, evitando completamente el paso por el duodeno y el yeyuno.  Este procedimiento promueve de manera exitosa la prdida de [place] Lakepeso. Sin embargo se Bots[place]ausa con menos frecuencia que otros tipos de Azerbaijanciruga debido al elevado riesgo de producir deficiencias nutricionales. Una variacin de Fox Pointeste  procedimiento incluye un "desvo duodenal". Aqu se deja una gran porcin de estmago intacto, incluido el ploro que regula la liberacin del contenido del estmago hacia el intestino delgado (vlvula pilrica). Tambin conserva una pequea parte del duodeno como parte del pasaje digestivo.  CULES SON LOS BENEFICIOS Y LOS RIESGOS DE LA CIRUGA BARITRICA? Beneficios generales  Inmediatamente despus de la Azerbaijanciruga, los pacientes pierden peso  rpidamente. Continan perdiendo peso durante 18-24 meses despus del procedimiento. La mayora de los pacientes recuperan del 5 al 10 por ciento del peso que perdieron, pero mantienen una prdida de peso de largo plazo cercano a las 100 libras (45 kg).  La mayor parte de las enfermedades relacionadas con la obesidad, como los niveles anormales de azcar en la [place]ant[place]angre, mejoran despus de la Azerbaijanciruga. Riesgos generales  Infeccin.  Hernias abdominales.  Rotura de la lnea de puntos.  Estiramiento de la salida del [place]estmago.  Desarrollo de clculos biliares. Los clculos biliares son cmulos de colesterol o de otras sustancias que se forman en la vescula. Durante perodos de prdida de peso rpida o sustancial el riesgo de que una persona desarrolle clculos biliares es mayor.  Deficiencias nutricionales. Alrededor del 30% de los pacientes sometidos a ciruga baritrica desarrollan deficiencias nutricionales. Estas incluyen anemia, osteoporosis, y enfermedad sea metablica.  Prdidas por alguna de las anastomosis. Esto puede poner en peligro la vida. Habr mayor riesgo cuanto ms anastomosis tenga la operacin.  Imposibilidad de perder o ganar peso. En los pacientes que no siguen la dieta de [address] Nemanera estricta, la bolsa del West Wendoverestmago se expande y no pierden Sport and exercise psychologistpeso.  Sndrome de Dumping. Se produce cuando los contenidos del estmago pasan demasiado rpido a travs del intestino delgado, causando clicos, diarrea, nuseas, palpitaciones, sudoracin, meteorismo y Golden West Financialmareos o [address]os. Riesgos especficos de las cirugas restrictivas:  Vmitos. Se producen cuando la pequea bolsa de estmago se expande demasiado debido a trozos de ConocoPhillipsalimentos que no se han Aeronautical engineermasticado bien.  Deslizamiento de la banda y prdida de la solucin salina. Esto puede ocurrir despus de Education officer, communityla colocacin de una banda gstrica ajustable.  Erosin de la banda en la luz del estmago.  Desgaste de la banda y ruptura de la sutura de  grapas. Esto puede ocurrir despus de una gastroplasta vertical con banda. En un pequeo nmero de casos, el jugo gstrico puede filtrarse hacia el abdomen. Si esto ocurre, es necesaria una ciruga de Associate Professoremergencia.  Muerte por complicaciones (raro). Esto ocurre en aproximadamente el 1% de los [place] Citycasos.  Prolapso del Teachers Insurance and Annuity Associationestmago. Riesgos especficos de las cirugas Jabil Circuitmalabsortivas Adems de los riesgos de estas cirugas restrictivas, las cirugas malabsortivas tambin conllevan un mayor riesgo de inducir deficiencias nutricionales. Esto se debe a que el procedimiento hace que la comida no pase por el duodeno y el yeyuno. Aqu es donde se absorbe la mayor parte del hierro y Psychiatric nurseel calcio. Cuanto ms extenso sea el bypass, mayor es el riesgo de que se produzcan complicaciones y deficiencias nutricionales.   Anemia.  Osteoporosis.  Enfermedad sea metablica. Las cirugas para corregir complicaciones son necesarias en aproximadamente el 10-20% de los Bridgeportpacientes.  Irven ShellingPARA OBTENER MS [facility] Medical Center At AubreyNFORMACIN American Society for Bariatric Surgery (Sociedad norteamericana de ciruga baritrica): www.asbs.org  Weight-control Information Network (WIN - Lubrizol Corporationed de informacin para el control del peso): win.MenusLocal.seniddk.nih.gov/health/nutrit/nutrit.htm  Document Released: 09/23/2005 Document Revised: 09/28/2013 [facility]ExitCare Patient Information 2015 Three WayExitCare, MarylandLLC. This information is not intended to replace advice given to you by your health care provider. Make sure you discuss any questions you have with your health  care provider.  

## 2014-05-26 NOTE — Progress Notes (Signed)
Whitney Schultz 1976/07/24 [id number]   History:    38 y.o.  for annual gyn exam with no complaints except weight gain. Patient was seen in the office on June 25 last year for her final postop visit. 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 Pathology report as follows:  Uterus and cervix  - BENIGN ENDOMETRIAL POLYP AND ADJACENT BENIGN SECRETORY ENDOMETRIUM, NO  ATYPIA, HYPERPLASIA OR MALIGNANCY.  - CERVIX: BENIGN SQUAMOUS MUCOSA AND ENDOCERVICAL MUCOSA, NO DYSPLASIA OR  MALIGNANCY.  - UTERINE SEROSA: UNREMARKABLE.  She has been great for her surgery. Patient would no previous history of abnormal Pap smears.    Past medical history,surgical history, family history and social history were all reviewed and documented in the EPIC chart.  Gynecologic History Patient's last menstrual period was 01/25/2013. Contraception: status post hysterectomy Last Pap: 2014. Results were: normal Last mammogram: Not indicated. Results were: Not indicated  Obstetric History OB History  Gravida Para Term Preterm AB SAB TAB Ectopic Multiple Living  2 2        2     # Outcome Date GA Lbr Len/2nd Weight Sex Delivery Anes PTL Lv  2 PAR           1 PAR                ROS: A ROS was performed and pertinent positives and negatives are included in the history.  GENERAL: No fevers or chills. HEENT: No change in vision, no earache, sore throat or sinus congestion. NECK: No pain or stiffness. CARDIOVASCULAR: No chest pain or pressure. No palpitations. PULMONARY: No shortness of breath, cough or wheeze. GASTROINTESTINAL: No abdominal pain, nausea, vomiting or diarrhea, melena or bright red blood per rectum. GENITOURINARY: No urinary frequency, urgency, hesitancy or dysuria. MUSCULOSKELETAL: No joint or muscle pain, no back pain, no recent trauma. DERMATOLOGIC: No rash, no itching, no lesions. ENDOCRINE: No polyuria, polydipsia, no heat  or cold intolerance. No recent change in weight. HEMATOLOGICAL: No anemia or easy bruising or bleeding. NEUROLOGIC: No headache, seizures, numbness, tingling or weakness. PSYCHIATRIC: No depression, no loss of interest in normal activity or change in sleep pattern.     Exam: chaperone present  BP 128/84  Ht 5' 4.75" (1.645 m)  Wt 270 lb (122.471 kg)  BMI 45.26 kg/m2  LMP 01/25/2013  Body mass index is 45.26 kg/(m^2).  General appearance : Well developed well nourished female. No acute distress HEENT: Neck supple, trachea midline, no carotid bruits, no thyroidmegaly Lungs: Clear to auscultation, no rhonchi or wheezes, or rib retractions  Heart: Regular rate and rhythm, no murmurs or gallops Breast:Examined in sitting and supine position were symmetrical in appearance, no palpable masses or tenderness,  no skin retraction, no nipple inversion, no nipple discharge, no skin discoloration, no axillary or supraclavicular lymphadenopathy Abdomen: no palpable masses or tenderness, no rebound or guarding Extremities: no edema or skin discoloration or tenderness  Pelvic:  Bartholin, Urethra, Skene Glands: Within normal limits             Vagina: No gross lesions or discharge  Cervix: Absent  Uterus absent  Adnexa  Without masses or tenderness  Anus and perineum  normal   Rectovaginal  normal sphincter tone without palpated masses or tenderness             Hemoccult that indicated     Assessment/Plan:  38 y.o. female for annual exam with no complaints today. Patient morbidly  obese. Patient did well from her laparoscopic hysterectomy. Patient will be referred to the general surgeon for consideration of bariatric surgery. The following labs were ordered today: CBC, fasting lipid profile, comprehensive metabolic panel, TSH, and urinalysis. Pap smear not done today.  Note: This dictation was prepared with  Dragon/digital dictation along withSmart phrase technology. Any transcriptional errors  that result from this process are unintentional.   Ok EdwardsFERNANDEZ,Melvena Vink H MD, 9:31 AM 05/26/2014

## 2014-05-28 LAB — URINALYSIS W MICROSCOPIC + REFLEX CULTURE
Bilirubin Urine: NEGATIVE
CASTS: NONE SEEN
CRYSTALS: NONE SEEN
GLUCOSE, UA: NEGATIVE mg/dL
Hgb urine dipstick: NEGATIVE
KETONES UR: NEGATIVE mg/dL
Leukocytes, UA: NEGATIVE
Nitrite: NEGATIVE
PH: 5.5 (ref 5.0–8.0)
Protein, ur: NEGATIVE mg/dL
Specific Gravity, Urine: 1.019 (ref 1.005–1.030)
Urobilinogen, UA: 0.2 mg/dL (ref 0.0–1.0)

## 2014-05-30 ENCOUNTER — Other Ambulatory Visit: Payer: Self-pay | Admitting: Gynecology

## 2014-05-30 LAB — URINE CULTURE

## 2014-05-30 MED ORDER — NITROFURANTOIN MONOHYD MACRO 100 MG PO CAPS
100.0000 mg | ORAL_CAPSULE | Freq: Two times a day (BID) | ORAL | Status: DC
Start: 2014-05-30 — End: 2017-01-20

## 2014-08-08 ENCOUNTER — Encounter: Payer: Self-pay | Admitting: Gynecology

## 2017-01-20 ENCOUNTER — Encounter: Payer: Self-pay | Admitting: Emergency Medicine

## 2017-01-20 ENCOUNTER — Emergency Department (INDEPENDENT_AMBULATORY_CARE_PROVIDER_SITE_OTHER)
Admission: EM | Admit: 2017-01-20 | Discharge: 2017-01-20 | Disposition: A | Discharge status: Home / Self Care | Payer: 59 | Source: Home / Self Care | Attending: Family Medicine | Admitting: Family Medicine

## 2017-01-20 DIAGNOSIS — B9789 Other viral agents as the cause of diseases classified elsewhere: Secondary | ICD-10-CM

## 2017-01-20 DIAGNOSIS — J0100 Acute maxillary sinusitis, unspecified: Secondary | ICD-10-CM | POA: Diagnosis not present

## 2017-01-20 DIAGNOSIS — J069 Acute upper respiratory infection, unspecified: Secondary | ICD-10-CM

## 2017-01-20 MED ORDER — AMOXICILLIN 875 MG PO TABS: 875.0000 mg | ORAL_TABLET | Freq: Two times a day (BID) | ORAL | 0 refills | Status: DC

## 2017-01-20 NOTE — Discharge Instructions (Signed)
Take plain guaifenesin (1200mg extended release tabs such as Mucinex) twice daily, with plenty of water, for cough and congestion.  May add Pseudoephedrine (30mg, one or two every 4 to 6 hours) for sinus congestion.  Get adequate rest.   May use Afrin nasal spray (or generic oxymetazoline) each morning for about 5 days and then discontinue.  Also recommend using saline nasal spray several times daily and saline nasal irrigation (AYR is a common brand).  Use Flonase nasal spray each morning after using Afrin nasal spray and saline nasal irrigation. Try warm salt water gargles for sore throat.  Stop all antihistamines for now, and other non-prescription cough/cold preparations. May take Delsym Cough Suppressant at bedtime for nighttime cough.  

## 2017-01-20 NOTE — ED Triage Notes (Signed)
Pt c/o facial pain and nasal drainage x2 weeks. States her eyes have been itching and red x2 days. Denies fever. She has been taking OTC allergy meds.

## 2017-01-20 NOTE — ED Provider Notes (Signed)
Whitney Schultz [facility]    CSN: [id number] Arrival date & time: 01/20/17  1137     History   Chief Complaint Chief Complaint  Patient presents with  . Facial Pain    HPI Whitney Schultz is a 41 y.o. female.   Patient has a history of seasonal rhinitis, and takes Allegra or Claritin, and Flonase.  During the past two weeks she has had increased sinus congestion and ear pressure.  She has had a cough and sore throat during the past two weeks.  During the past three days she has had chills, and believes that she has had a fever.  She is now having facial and sinus pressure.   The history is provided by the patient.    Past Medical History:  Diagnosis Date  . Anemia   . GERD (gastroesophageal reflux disease)   . Insomnia   . Migraines     Patient Active Problem List   Diagnosis Date Noted  . Morbidly obese (HCC) 05/26/2014  . Anemia 02/03/2013  . Migraines 11/11/2012    Past Surgical History:  Procedure Laterality Date  . ABDOMINAL HYSTERECTOMY    . CESAREAN SECTION  [id number]   x2 with BTSP  . LAPAROSCOPIC HYSTERECTOMY N/A 02/15/2013   Procedure: HYSTERECTOMY TOTAL LAPAROSCOPIC;  Surgeon: Ok Edwards, MD;  Location: [facility];  Service: Gynecology;  Laterality: N/A;  CPT [phone]  Dr. Colin Broach to assist.  . LITHOTRIPSY  2000    OB History    Gravida Para Term Preterm AB Living   SAB TAB Ectopic Multiple Live Births                   Home Medications    Prior to Admission medications   Medication Sig Start Date End Date Taking? Authorizing Provider  amoxicillin (AMOXIL) 875 MG tablet Take 1 tablet (875 mg total) by mouth 2 (two) times daily. 01/20/17   Lattie Haw, MD  Cyanocobalamin (VITAMIN B-12 CR PO) Take by mouth.    Historical Provider, MD  eletriptan (RELPAX) 20 MG tablet Take 20 mg by mouth as needed for migraine or headache. One tablet by mouth at onset of headache. May repeat in 2 hours if headache persists or recurs.     Historical Provider, MD  imipramine (TOFRANIL) 10 MG tablet Take 10 mg by mouth at bedtime.    Historical Provider, MD  loratadine (CLARITIN) 10 MG tablet Take 10 mg by mouth daily.    Historical Provider, MD  omeprazole (PRILOSEC) 20 MG capsule Take 40 mg by mouth daily.     Historical Provider, MD  SUMAtriptan (IMITREX) 50 MG tablet Take 50 mg by mouth every 2 (two) hours as needed for migraine or headache. May repeat in 2 hours if headache persists or recurs.    Historical Provider, MD    Family History Family History  Problem Relation Age of Onset  . Hypertension Mother   . Diabetes Maternal Grandmother   . Diabetes Paternal Grandfather     Social History Social History  Substance Use Topics  . Smoking status: Never Smoker  . Smokeless tobacco: Never Used  . Alcohol use No     Allergies   Prednisone   Review of Systems Review of Systems + sore throat + cough No pleuritic pain No wheezing + nasal congestion + post-nasal drainage + sinus pain/pressure + itchy/red eyes ? earache No hemoptysis No SOB ? fever, +  chills No nausea No vomiting No abdominal pain No diarrhea No urinary symptoms No skin rash + fatigue No myalgias No headache Used OTC meds without relief   Physical Exam Triage Vital Signs ED Triage Vitals  Enc Vitals Group     BP 01/20/17 1301 (!) 142/80     Pulse Rate 01/20/17 1301 86     Resp --      Temp 01/20/17 1301 97.6 F (36.4 C)     Temp Source 01/20/17 1301 Oral     SpO2 01/20/17 1301 96 %     Weight 01/20/17 1301 274 lb (124.3 kg)     Height --      Head Circumference --      Peak Flow --      Pain Score 01/20/17 1302 0     Pain Loc --      Pain Edu? --      Excl. in GC? --    No data found.   Updated Vital Signs BP (!) 142/80 (BP Location: Right Arm)   Pulse 86   Temp 97.6 F (36.4 C) (Oral)   Wt 274 lb (124.3 kg)   LMP 01/25/2013   SpO2 96%   BMI 45.95 kg/m   Visual Acuity Right Eye Distance:   Left Eye  Distance:   Bilateral Distance:    Right Eye Near:   Left Eye Near:    Bilateral Near:     Physical Exam Nursing notes and Vital Signs reviewed. Appearance:  Patient appears stated age, and in no acute distress Eyes:  Pupils are equal, round, and reactive to light and accomodation.  Extraocular movement is intact.  Conjunctivae are not inflamed  Ears:  Canals normal.  Tympanic membranes normal.  Nose:  Congested turbinates.  Maxillary sinus tenderness is present.  Pharynx:  Normal Neck:  Supple.  Tender enlarged posterior/lateral nodes are palpated bilaterally  Lungs:  Clear to auscultation.  Breath sounds are equal.  Moving air well. Heart:  Regular rate and rhythm without murmurs, rubs, or gallops.  Abdomen:  Nontender without masses or hepatosplenomegaly.  Bowel sounds are present.  No CVA or flank tenderness.  Extremities:  No edema.  Skin:  No rash present.    UC Treatments / Results  Labs (all labs ordered are listed, but only abnormal results are displayed) Labs Reviewed - No data to display  EKG  EKG Interpretation None       Radiology No results found.  Procedures Procedures (including critical care time)  Medications Ordered in UC Medications - No data to display   Initial Impression / Assessment and Plan / UC Course  I have reviewed the triage vital signs and the nursing notes.  Pertinent labs & imaging results that were available during my care of the patient were reviewed by me and considered in my medical decision making (see chart for details).    Begin amoxicillin Take plain guaifenesin (  extended release tabs such as Mucinex) twice daily, with plenty of water, for cough and congestion.  May add Pseudoephedrine ( , one or two every 4 to 6 hours) for sinus congestion.  Get adequate rest.   May use Afrin nasal spray (or generic oxymetazoline) each morning for about 5 days and then discontinue.  Also recommend using saline nasal spray several  times daily and saline nasal irrigation (AYR is a common brand).  Use Flonase nasal spray each morning after using Afrin nasal spray and saline nasal irrigation. Try warm salt water gargles  for sore throat.  Stop all antihistamines for now, and other non-prescription cough/cold preparations. May take Delsym Cough Suppressant at bedtime for nighttime cough.  Followup with Family Doctor if not improved in one week.     Final Clinical Impressions(s) / UC Diagnoses   Final diagnoses:  Acute maxillary sinusitis, recurrence not specified  Viral URI with cough    New Prescriptions New Prescriptions   AMOXICILLIN (AMOXIL) 875 MG TABLET    Take 1 tablet (875 mg total) by mouth 2 (two) times daily.     Lattie Haw, MD 02/05/17 [phone]

## 2017-02-19 ENCOUNTER — Encounter: Payer: Self-pay | Admitting: Gynecology

## 2020-03-22 NOTE — Progress Notes (Signed)
New Patient Note  RE: Whitney Schultz MRN: [id number] DOB: [birth year] Date of Office Visit: 03/23/2020  Referring provider: Cari Caraway, MD Primary care provider: Cari Caraway, MD  Chief Complaint: Allergic Rhinitis  (Establish care interested in Immunotherapy)  History of Present Illness: I had the pleasure of seeing Whitney Schultz for initial evaluation at the [facility] on 03/23/2020. She is a 44 y.o. female, who is referred here by Cari Caraway, MD for the evaluation of allergic rhinitis.  Patient was seen by [facility] in the past for allergic rhinitis.   She reports symptoms of sneezing, watery eyes, sinus pressure/pain, nasal congestion, ear pain/itching, PND, coughing. Symptoms have been going on for 10 years. The symptoms are present all year around. Other triggers include exposure to denies. Anosmia: diminished sense of smell. Headache: yes. She has used zyrtec, Claritin, Clarinex, Flonase, azelastine, Singulair, benadryl with minimal improvement in symptoms. Singulair caused some side effects. Sinus infections: none in the past year that was treated. Previous work up includes: 2016 skin intradermal testing positive to dust mites, cockroach, mold.  Previous ENT evaluation: denies. Previous sinus imaging: no. History of nasal polyps: no. Last eye exam: 1 year ago. History of reflux: yes and takes omeprazole.  Assessment and Plan: [name] is a 44 y.o. female with: Other allergic rhinitis Perennial rhinitis symptoms with headaches, sinus pressure and nasal congestion for 10 years.  Tried over-the-counter antihistamines, Flonase, azelastine and Singulair with minimal benefit.  2016 and intradermal testing was positive to dust mites, cockroach and few molds.  No previous ENT evaluation.  No previous allergy immunotherapy.  Today's intradermal skin testing showed: positive to dust mites and cockroaches.  Start environmental control measures as  below.  Take Ryvent 6mg  1 tablet three times a day as needed for allergic symptoms.  Stop all other antihistamines.  Start dymista (fluticasone + azelastine nasal spray combination) 1 spray per nostril twice a day. This replaces Flonase (fluticasone) and azelastine for now. If it's not covered let us know.   Nasal saline spray (i.e., Simply Saline) or nasal saline lavage (i.e., NeilMed) is recommended as needed and prior to medicated nasal sprays.  Had a detailed discussion with patient/family that clinical history is suggestive of allergic rhinitis, and may benefit from allergy immunotherapy (AIT). Discussed in detail regarding the dosing, schedule, side effects (mild to moderate local allergic reaction and rarely systemic allergic reactions including anaphylaxis), and benefits (significant improvement in nasal symptoms, seasonal flares of asthma) of immunotherapy with the patient. There is significant time commitment involved with allergy shots, which includes weekly immunotherapy injections for first 9-12 months and then biweekly to monthly injections for 3-5 years.   Consent was signed.  Start allergy injections.  Refer to ENT - Dr. Benjamine Mola to rule out any anatomical issues that may be contributing to her symptoms.  History of upper respiratory infection History of frequent sinus infections which may be contributed by her perennial allergens.  Monitor symptoms and if still persistent then will consider obtaining basic immune evaluation.  Return in about 3 months (around 06/23/2020).  Meds ordered this encounter  Medications  . Azelastine-Fluticasone 137-50 MCG/ACT SUSP    Sig: Place 1 spray into the nose in the morning and at bedtime.    Dispense:  23 g    Refill:  5  . Carbinoxamine Maleate (RYVENT) 6 MG TABS    Sig: Take 1 tablet by mouth 3 (three) times daily as needed (allergies).    Dispense:  90 tablet    Refill:  2  . EPINEPHrine (AUVI-Q) 0.3 mg/0.3 mL IJ SOAJ injection     Sig: Inject 0.3 mLs (0.3 mg total) into the muscle once for 1 dose. As directed for life-threatening allergic reactions    Dispense:  1 each    Refill:  2   Other allergy screening: Asthma: no Rhino conjunctivitis: yes Food allergy: no Medication allergy:   Prednisone - rash Hymenoptera allergy: no Urticaria: no Eczema:no History of recurrent infections suggestive of immunodeficency: no  Diagnostics: Skin Testing: Environmental allergy panel. Positive test to: dust mites and cockroaches. Results discussed with patient/family.  Airborne Adult Perc - 03/23/20 0915    Time Antigen Placed 0915    Allergen Manufacturer Waynette Buttery    Location Back    Number of Test 59    Panel 1 Select    1. Control-Buffer 50% Glycerol Negative    2. Control-Histamine 1 mg/ml 2+    3. Albumin saline Negative    4. Bahia Negative    5. French Southern Territories Negative    6. Johnson Negative    7. Kentucky Blue Negative    8. Meadow Fescue Negative    9. Perennial Rye Negative    10. Sweet Vernal Negative    11. Timothy Negative    12. Cocklebur Negative    13. Burweed Marshelder Negative    14. Ragweed, short Negative    15. Ragweed, Giant Negative    16. Plantain,  English Negative    17. Lamb's Quarters Negative    18. Sheep Sorrell Negative    19. Rough Pigweed Negative    20. Marsh Elder, Rough Negative    21. Mugwort, Common Negative    22. Ash mix Negative    23. Birch mix Negative    24. Beech American Negative    25. Box, Elder Negative    26. Cedar, red Negative    27. Cottonwood, Guinea-Bissau Negative    28. Elm mix Negative    29. Hickory Negative    30. Maple mix Negative    31. Oak, Guinea-Bissau mix Negative    32. Pecan Pollen Negative    33. Pine mix Negative    34. Sycamore Eastern Negative    35. Walnut, Black Pollen Negative    36. Alternaria alternata Negative    37. Cladosporium Herbarum Negative    38. Aspergillus mix Negative    39. Penicillium mix Negative    40. Bipolaris sorokiniana  (Helminthosporium) Negative    41. Drechslera spicifera (Curvularia) Negative    42. Mucor plumbeus Negative    43. Fusarium moniliforme Negative    44. Aureobasidium pullulans (pullulara) Negative    45. Rhizopus oryzae Negative    46. Botrytis cinera Negative    47. Epicoccum nigrum Negative    48. Phoma betae Negative    49. Candida Albicans Negative    50. Trichophyton mentagrophytes Negative    51. Mite, D Farinae  5,000 AU/ml Negative    52. Mite, D Pteronyssinus  5,000 AU/ml Negative    53. Cat Hair 10,000 BAU/ml Negative    54.  Dog Epithelia Negative    55. Mixed Feathers Negative    56. Horse Epithelia Negative    57. Cockroach, German Negative    58. Mouse Negative    59. Tobacco Leaf Negative          Intradermal - 03/23/20 0935    Time Antigen Placed 0935    Allergen Manufacturer Waynette Buttery  Location Arm    Number of Test 15    Intradermal Select    Control Negative    French Southern Territories Negative    Johnson Negative    7 Grass Negative    Ragweed mix Negative    Weed mix Negative    Tree mix Negative    Mold 1 Negative    Mold 2 Negative    Mold 3 Negative    Mold 4 Negative    Cat Negative    Dog Negative    Cockroach 3+    Mite mix 3+           Past Medical History: Patient Active Problem List   Diagnosis Date Noted  . Other allergic rhinitis 03/23/2020  . History of upper respiratory infection 03/23/2020  . Morbidly obese (HCC) 05/26/2014  . Anemia 02/03/2013  . Migraines 11/11/2012   Past Medical History:  Diagnosis Date  . Anemia   . Angio-edema   . GERD (gastroesophageal reflux disease)   . Insomnia   . Migraines   . Urticaria    Past Surgical History: Past Surgical History:  Procedure Laterality Date  . ABDOMINAL HYSTERECTOMY    . CESAREAN SECTION  [id number]   x2 with BTSP  . LAPAROSCOPIC HYSTERECTOMY N/A 02/15/2013   Procedure: HYSTERECTOMY TOTAL LAPAROSCOPIC;  Surgeon: Ok Edwards, MD;  Location: [facility];  Service: Gynecology;   Laterality: N/A;  CPT [phone]  Dr. Colin Broach to assist.  . LITHOTRIPSY  2000   Medication List:  Current Outpatient Medications  Medication Sig Dispense Refill  . azelastine (OPTIVAR) 0.05 % ophthalmic solution 1 drop 2 (two) times daily.    . metFORMIN (GLUCOPHAGE-XR) 500 MG 24 hr tablet Take one po daily for a week and then increase to 2 po daily    . naproxen sodium (ALEVE) 220 MG tablet Take 220 mg by mouth.    Marland Kitchen omeprazole (PRILOSEC) 20 MG capsule Take 40 mg by mouth daily.     . ondansetron (ZOFRAN) 4 MG tablet TAKE 1 TO 2 TABLETS BY MOUTH AT ONSET OF HEADACHE UP TO TWICE DAILY.    . SUMAtriptan (IMITREX) 50 MG tablet Take 50 mg by mouth every 2 (two) hours as needed for migraine or headache. May repeat in 2 hours if headache persists or recurs.    . Azelastine-Fluticasone 137-50 MCG/ACT SUSP Place 1 spray into the nose in the morning and at bedtime. 23 g 5  . Carbinoxamine Maleate (RYVENT) 6 MG TABS Take 1 tablet by mouth 3 (three) times daily as needed (allergies). 90 tablet 2  . eletriptan (RELPAX) 20 MG tablet Take 20 mg by mouth as needed for migraine or headache. One tablet by mouth at onset of headache. May repeat in 2 hours if headache persists or recurs.    Marland Kitchen EPINEPHrine (AUVI-Q) 0.3 mg/0.3 mL IJ SOAJ injection Inject 0.3 mLs (0.3 mg total) into the muscle once for 1 dose. As directed for life-threatening allergic reactions 1 each 2   No current facility-administered medications for this visit.   Allergies: Allergies  Allergen Reactions  . Prednisone Rash   Social History: Social History   Socioeconomic History  . Marital status: Married    Spouse name: Not on file  . Number of children: Not on file  . Years of education: Not on file  . Highest education level: Not on file  Occupational History  . Not on file  Tobacco Use  . Smoking status: Never Smoker  . Smokeless tobacco:  Never Used  Vaping Use  . Vaping Use: Never used  Substance and Sexual Activity    . Alcohol use: No  . Drug use: No  . Sexual activity: Yes    Birth control/protection: Surgical  Other Topics Concern  . Not on file  Social History Narrative  . Not on file   Social Determinants of Health   Financial Resource Strain:   . Difficulty of Paying Living Expenses:   Food Insecurity:   . Worried About Programme researcher, broadcasting/film/videounning Out of Food in the Last Year:   . Baristaan Out of Food in the Last Year:   Transportation Needs:   . Freight forwarderLack of Transportation (Medical):   Marland Kitchen. Lack of Transportation (Non-Medical):   Physical Activity:   . Days of Exercise per Week:   . Minutes of Exercise per Session:   Stress:   . Feeling of Stress :   Social Connections:   . Frequency of Communication with Friends and Family:   . Frequency of Social Gatherings with Friends and Family:   . Attends Religious Services:   . Active Member of Clubs or Organizations:   . Attends BankerClub or Organization Meetings:   Marland Kitchen. Marital Status:    Lives in a house which is 45 years old. Smoking: denies Occupation: Education officer, communitymetal health therapist.  Environmental History: Water Damage/mildew in the house: no Carpet in the family room: no Carpet in the bedroom: yes Heating: electric Cooling: central Pet: yes 1 dog x <1 yr  Family History: Family History  Problem Relation Age of Onset  . Hypertension Mother   . Eczema Mother   . Allergic rhinitis Mother   . Asthma Mother   . Diabetes Maternal Grandmother   . Asthma Maternal Grandmother   . Diabetes Paternal Grandfather   . Asthma Sister   . Urticaria Neg Hx    Review of Systems  Constitutional: Negative for appetite change, chills, fever and unexpected weight change.  HENT: Positive for congestion, ear pain, postnasal drip, rhinorrhea, sinus pressure, sinus pain and sneezing.   Eyes: Positive for itching.  Respiratory: Positive for cough. Negative for chest tightness, shortness of breath and wheezing.   Cardiovascular: Negative for chest pain.  Gastrointestinal: Negative for  abdominal pain.  Genitourinary: Negative for difficulty urinating.  Skin: Negative for rash.  Allergic/Immunologic: Positive for environmental allergies. Negative for food allergies.  Neurological: Positive for headaches.   Objective: BP 128/90 (BP Location: Right Arm, Patient Position: Sitting, Cuff Size: Large)   Pulse 83   Temp 97.9 F (36.6 C) (Temporal)   Resp 16   Ht 5' 4.57" (1.64 m)   Wt 289 lb (131.1 kg)   LMP 01/25/2013   SpO2 96%   BMI 48.74 kg/m  Body mass index is 48.74 kg/m. Physical Exam Vitals and nursing note reviewed.  Constitutional:      Appearance: Normal appearance. She is well-developed. She is obese.  HENT:     Head: Normocephalic and atraumatic.     Right Ear: Tympanic membrane and external ear normal.     Left Ear: Tympanic membrane and external ear normal.     Nose: Nose normal.     Mouth/Throat:     Mouth: Mucous membranes are moist.     Pharynx: Oropharynx is clear.  Eyes:     Conjunctiva/sclera: Conjunctivae normal.  Cardiovascular:     Rate and Rhythm: Normal rate and regular rhythm.     Heart sounds: Normal heart sounds. No murmur heard.  No friction rub. No gallop.  Pulmonary:     Effort: Pulmonary effort is normal.     Breath sounds: Normal breath sounds. No wheezing, rhonchi or rales.  Abdominal:     Palpations: Abdomen is soft.  Musculoskeletal:     Cervical back: Neck supple.  Skin:    General: Skin is warm.     Findings: No rash.  Neurological:     Mental Status: She is alert and oriented to person, place, and time.  Psychiatric:        Mood and Affect: Mood normal.        Behavior: Behavior normal.    The plan was reviewed with the patient/family, and all questions/concerned were addressed.  It was my pleasure to see Roneisha today and participate in her care. Please feel free to contact me with any questions or concerns.  Sincerely,  Wyline Mood, DO Allergy & Immunology  [facility] office: [phone] [facility] office: [phone] [place] office: [phone]

## 2020-03-23 ENCOUNTER — Telehealth: Payer: Self-pay

## 2020-03-23 ENCOUNTER — Encounter: Payer: Self-pay | Admitting: Allergy

## 2020-03-23 ENCOUNTER — Other Ambulatory Visit: Payer: Self-pay

## 2020-03-23 ENCOUNTER — Ambulatory Visit (INDEPENDENT_AMBULATORY_CARE_PROVIDER_SITE_OTHER): Payer: 59 | Admitting: Allergy

## 2020-03-23 VITALS — BP 128/90 | HR 83 | Temp 97.9°F | Resp 16 | Ht 64.57 in | Wt 289.0 lb

## 2020-03-23 DIAGNOSIS — Z8709 Personal history of other diseases of the respiratory system: Secondary | ICD-10-CM | POA: Diagnosis not present

## 2020-03-23 DIAGNOSIS — J3089 Other allergic rhinitis: Secondary | ICD-10-CM | POA: Diagnosis not present

## 2020-03-23 DIAGNOSIS — R0981 Nasal congestion: Secondary | ICD-10-CM

## 2020-03-23 MED ORDER — CARBINOXAMINE MALEATE 6 MG PO TABS: 1.0000 | ORAL_TABLET | Freq: Three times a day (TID) | ORAL | 2 refills | Status: DC | PRN

## 2020-03-23 MED ORDER — CARBINOXAMINE MALEATE 4 MG PO TABS: ORAL_TABLET | ORAL | 2 refills | Status: DC

## 2020-03-23 MED ORDER — EPINEPHRINE 0.3 MG/0.3ML IJ SOAJ
0.3000 mg | Freq: Once | INTRAMUSCULAR | 2 refills | Status: AC
Start: 2020-03-23 — End: 2020-03-23

## 2020-03-23 MED ORDER — AZELASTINE-FLUTICASONE 137-50 MCG/ACT NA SUSP: 1.0000 | Freq: Two times a day (BID) | NASAL | 5 refills | Status: AC

## 2020-03-23 NOTE — Progress Notes (Unsigned)
EXP 03/27/21 

## 2020-03-23 NOTE — Patient Instructions (Addendum)
Today's skin testing showed: positive to dust mites and cockroaches.  Environmental allergies  Start environmental control measures as below.  Take Ryvent 6mg  1 tablet three times a day as needed for allergic symptoms.  Stop all other antihistamines.  Start dymista (fluticasone + azelastine nasal spray combination) 1 spray per nostril twice a day. This replaces Flonase (fluticasone) and azelastine for now. If it's not covered let know.   Nasal saline spray (i.e., Simply Saline) or nasal saline lavage (i.e., NeilMed) is recommended as needed and prior to medicated nasal sprays.   Had a detailed discussion with patient/family that clinical history is suggestive of allergic rhinitis, and may benefit from allergy immunotherapy (AIT). Discussed in detail regarding the dosing, schedule, side effects (mild to moderate local allergic reaction and rarely systemic allergic reactions including anaphylaxis), and benefits (significant improvement in nasal symptoms, seasonal flares of asthma) of immunotherapy with the patient. There is significant time commitment involved with allergy shots, which includes weekly immunotherapy injections for first 9-12 months and then biweekly to monthly injections for 3-5 years.   Consent was signed.  Start allergy injections.   Refer to ENT - Dr. [place] for evaluation for chronic nasal congestion.   Follow up in 3 weeks for allergy shots and 3 months for office visit.    Control of House Dust Mite Allergen . Dust mite allergens are a common trigger of allergy and asthma symptoms. While they can be found throughout the house, these microscopic creatures thrive in warm, humid environments such as bedding, upholstered furniture and carpeting. . Because so much time is spent in the bedroom, it is essential to reduce mite levels there.  . Encase pillows, mattresses, and box springs in special allergen-proof fabric covers or airtight, zippered plastic covers.  . Bedding  should be washed weekly in hot water (130 F) and dried in a hot dryer. Allergen-proof covers are available for comforters and pillows that can't be regularly washed.  Suszanne Conners the allergy-proof covers every few months. Minimize clutter in the bedroom. Keep pets out of the bedroom.  Reyes Ivan Keep humidity less than 50% by using a dehumidifier or air conditioning. You can buy a humidity measuring device called a hygrometer to monitor this.  . If possible, replace carpets with hardwood, linoleum, or washable area rugs. If that's not possible, vacuum frequently with a vacuum that has a HEPA filter. . Remove all upholstered furniture and non-washable window drapes from the bedroom. . Remove all non-washable stuffed toys from the bedroom.  Wash stuffed toys weekly. Cockroach Allergen Avoidance Cockroaches are often found in the homes of densely populated urban areas, schools or commercial buildings, but these creatures can lurk almost anywhere. This does not mean that you have a dirty house or living area. . Block all areas where roaches can enter the home. This includes crevices, wall cracks and windows.  . Cockroaches need water to survive, so fix and seal all leaky faucets and pipes. Have an exterminator go through the house when your family and pets are gone to eliminate any remaining roaches. Marland Kitchen Keep food in lidded containers and put pet food dishes away after your pets are done eating. Vacuum and sweep the floor after meals, and take out garbage and recyclables. Use lidded garbage containers in the kitchen. Wash dishes immediately after use and clean under stoves, refrigerators or toasters where crumbs can accumulate. Wipe off the stove and other kitchen surfaces and cupboards regularly.

## 2020-03-23 NOTE — Telephone Encounter (Signed)
Can you please refer patient to ENT Dr. Suszanne Conners for chronic nasal congestion.  Thank you

## 2020-03-23 NOTE — Assessment & Plan Note (Signed)
Perennial rhinitis symptoms with headaches, sinus pressure and nasal congestion for 10 years.  Tried over-the-counter antihistamines, Flonase, azelastine and Singulair with minimal benefit.  2016 and intradermal testing was positive to dust mites, cockroach and few molds.  No previous ENT evaluation.  No previous allergy immunotherapy.  Today's intradermal skin testing showed: positive to dust mites and cockroaches.  Start environmental control measures as below.  Take Ryvent 6mg  1 tablet three times a day as needed for allergic symptoms.  Stop all other antihistamines.  Start dymista (fluticasone + azelastine nasal spray combination) 1 spray per nostril twice a day. This replaces Flonase (fluticasone) and azelastine for now. If it's not covered let know.   Nasal saline spray (i.e., Simply Saline) or nasal saline lavage (i.e., NeilMed) is recommended as needed and prior to medicated nasal sprays.  Had a detailed discussion with patient/family that clinical history is suggestive of allergic rhinitis, and may benefit from allergy immunotherapy (AIT). Discussed in detail regarding the dosing, schedule, side effects (mild to moderate local allergic reaction and rarely systemic allergic reactions including anaphylaxis), and benefits (significant improvement in nasal symptoms, seasonal flares of asthma) of immunotherapy with the patient. There is significant time commitment involved with allergy shots, which includes weekly immunotherapy injections for first 9-12 months and then biweekly to monthly injections for 3-5 years.   Consent was signed.  Start allergy injections.  Refer to ENT - Dr. [place] to rule out any anatomical issues that may be contributing to her symptoms.

## 2020-03-23 NOTE — Assessment & Plan Note (Signed)
History of frequent sinus infections which may be contributed by her perennial allergens.  Monitor symptoms and if still persistent then will consider obtaining basic immune evaluation.

## 2020-03-27 NOTE — Telephone Encounter (Signed)
Referral has been placed to Dr Luther Hearing office for review and scheduling.  Our phone system is currently down to contact the patients mom.  Fredric Mare do you mind calling the mom to inform her of this information?  Su Philomena Doheny, MD, PA (ENT) Otolaryngologist [address] [place] [address]  [phone]

## 2020-03-28 NOTE — Telephone Encounter (Signed)
Left voicemail informing of ENT referral. Patient called right back and I gave patient all information. Patient verbalized understanding.

## 2020-04-13 ENCOUNTER — Ambulatory Visit (INDEPENDENT_AMBULATORY_CARE_PROVIDER_SITE_OTHER): Payer: 59

## 2020-04-13 ENCOUNTER — Other Ambulatory Visit: Payer: Self-pay

## 2020-04-13 DIAGNOSIS — J309 Allergic rhinitis, unspecified: Secondary | ICD-10-CM

## 2020-04-13 NOTE — Progress Notes (Signed)
Immunotherapy   Patient Details  Name: Whitney Schultz MRN: [id number] Date of Birth: [birth year]  04/13/2020  Whitney Schultz here to start allergy injection. Patient received 0.05 ml of her blue vial DM-CR with an expiration of 03/27/2021. Patient waited in an exam room for 30 minutes with no problem Following schedule: B  Frequency: 1-2 times weekly Epi-Pen: Yes Consent signed and patient instructions given.   Dub Mikes 04/13/2020, 8:39 AM

## 2020-04-27 ENCOUNTER — Other Ambulatory Visit: Payer: Self-pay

## 2020-04-27 ENCOUNTER — Ambulatory Visit (INDEPENDENT_AMBULATORY_CARE_PROVIDER_SITE_OTHER): Payer: 59

## 2020-04-27 DIAGNOSIS — J309 Allergic rhinitis, unspecified: Secondary | ICD-10-CM

## 2020-05-25 ENCOUNTER — Ambulatory Visit (INDEPENDENT_AMBULATORY_CARE_PROVIDER_SITE_OTHER): Payer: 59

## 2020-05-25 ENCOUNTER — Other Ambulatory Visit: Payer: Self-pay

## 2020-05-25 DIAGNOSIS — J309 Allergic rhinitis, unspecified: Secondary | ICD-10-CM | POA: Diagnosis not present

## 2020-06-06 ENCOUNTER — Ambulatory Visit (INDEPENDENT_AMBULATORY_CARE_PROVIDER_SITE_OTHER): Payer: 59

## 2020-06-06 ENCOUNTER — Other Ambulatory Visit: Payer: Self-pay

## 2020-06-06 DIAGNOSIS — J309 Allergic rhinitis, unspecified: Secondary | ICD-10-CM

## 2020-06-26 NOTE — Progress Notes (Signed)
Follow Up Note  RE: Whitney Schultz MRN: [id number] DOB: Oct 29, 1975 Date of Office Visit: 06/27/2020  Referring provider: Gweneth Dimitri, MD Primary care provider: Gweneth Dimitri, MD  Chief Complaint: Allergic Rhinitis   History of Present Illness: I had the pleasure of seeing [name] for a follow up visit at the [facility] on 06/27/2020. She is a 44 y.o. female, who is being followed for allergic rhinitis on AIT and history of upper respiratory infections. Her previous allergy office visit was on 03/23/2020 with Dr. Selena Batten. Today is a regular follow up visit.  Allergic rhinitis Patient only received a few injections as her work schedule changed and was unable to get into the office.  Still having issues especially when she's outdoors. Patient has been using dymista 1 spray once a day with some benefit but still doesn't like the taste of it. No nosebleeds.  Using carbinoxamine 4mg  tablet in the mornings with some benefit.  Some dry eyes.   She was not able to see ENT yet.   Patient has been seeing neurology for migraines and is receiving injections with good benefit.  History of upper respiratory infection No additional infections since the last OV.  Assessment and Plan: [name] is a 44 y.o. female with: Perennial allergic rhinitis Past history - Perennial rhinitis symptoms with headaches, sinus pressure and nasal congestion for 10 years.  Tried over-the-counter antihistamines, Flonase, azelastine and Singulair with minimal benefit.  2016 and intradermal testing was positive to dust mites, cockroach and few molds. 2021  intradermal skin testing showed: positive to dust mites and cockroaches. 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 No ENT evaluation. Having dry and itchy eyes.   Restart weekly allergy injections - given today.   Continue environmental control measures as below. Start Singulair  (montelukast) 10mg  daily at night. Cautioned that in some children/adults can experience behavioral changes including hyperactivity, agitation, depression, sleep disturbances and suicidal ideations. These side effects are rare, but if you notice them you should notify me and discontinue Singulair (montelukast).  May take carbinoxamine 4mg  tablet up to 3 times a day as needed for allergies. May use dymista (fluticasone + azelastine nasal spray combination) 1 spray per nostril twice a day.   Nasal saline spray (i.e., Simply Saline) or nasal saline lavage (i.e., NeilMed) is recommended as needed and prior to medicated nasal sprays.  May use refresh eye drops for the eyes.   Refer to ENT - Dr. 06/14/2020 for evaluation for chronic nasal congestion.   Allergic conjunctivitis of both eyes  See assessment and plan as above for allergic rhinitis.  History of upper respiratory infection Past history - frequent sinus infections which may be contributed by her perennial allergens. Interim history - no infections since last visit.   Monitor symptoms and if still persistent then will consider obtaining basic immune evaluation.  Return in about 6 months (around 12/25/2020).  Meds ordered this encounter  Medications  . montelukast (SINGULAIR) 10 MG tablet    Sig: Take 1 tablet (10 mg total) by mouth at bedtime.    Dispense:  30 tablet    Refill:  5   Diagnostics: None.  Medication List:  Current Outpatient Medications  Medication Sig Dispense Refill  . AJOVY 225 MG/1.5ML SOAJ Inject into the skin.    amitriptyline (ELAVIL) 10 MG tablet     . Azelastine-Fluticasone 137-50 MCG/ACT SUSP Place 1 spray into the nose in the morning  and at bedtime. 23 g 5  . Carbinoxamine Maleate 4 MG TABS Take 1 to 1.5 tablets every 8 hours as needed for allergies. 45 tablet 2  . cyclobenzaprine (FLEXERIL) 10 MG tablet Take 10 mg by mouth 3 (three) times daily as needed.    . furosemide (LASIX) 20 MG tablet Take 20  mg by mouth daily.    . metFORMIN (GLUCOPHAGE-XR) 500 MG 24 hr tablet Take one po daily for a week and then increase to 2 po daily    . naproxen sodium (ALEVE) 220 MG tablet Take 220 mg by mouth.    Marland Kitchen omeprazole (PRILOSEC) 40 MG capsule Take 40 mg by mouth daily.    . ondansetron (ZOFRAN) 4 MG tablet TAKE 1 TO 2 TABLETS BY MOUTH AT ONSET OF HEADACHE UP TO TWICE DAILY.    Marland Kitchen prochlorperazine (COMPAZINE) 10 MG tablet Take by mouth.    . Semaglutide-Weight Management 0.5 MG/0.5ML SOAJ Inject into the skin.    . SUMAtriptan (IMITREX) 100 MG tablet Take 100 mg by mouth 2 (two) times daily as needed.    . SUMAtriptan (IMITREX) 50 MG tablet Take 50 mg by mouth every 2 (two) hours as needed for migraine or headache. May repeat in 2 hours if headache persists or recurs.    . topiramate (TOPAMAX) 50 MG tablet TAKE 1 TABLET BY MOUTH DAILY AT NOON FOR 2 WEEKS. INCREASE TO 2 BY MOUTH DAILY AT NOON    . eletriptan (RELPAX) 20 MG tablet Take 20 mg by mouth as needed for migraine or headache. One tablet by mouth at onset of headache. May repeat in 2 hours if headache persists or recurs.    . montelukast (SINGULAIR) 10 MG tablet Take 1 tablet (10 mg total) by mouth at bedtime. 30 tablet 5   No current facility-administered medications for this visit.   Allergies: Allergies  Allergen Reactions  . Prednisone Rash   I reviewed her past medical history, social history, family history, and environmental history and no significant changes have been reported from her previous visit.  Review of Systems  Constitutional: Negative for appetite change, chills, fever and unexpected weight change.  HENT: Positive for congestion, ear pain, postnasal drip, rhinorrhea, sinus pressure, sinus pain and sneezing.   Eyes: Positive for itching.  Respiratory: Negative for cough, chest tightness, shortness of breath and wheezing.   Cardiovascular: Negative for chest pain.  Gastrointestinal: Negative for abdominal pain.   Genitourinary: Negative for difficulty urinating.  Skin: Negative for rash.  Allergic/Immunologic: Positive for environmental allergies. Negative for food allergies.  Neurological: Positive for headaches.   Objective: BP 130/80   Pulse 84   Resp 19   LMP 01/25/2013   SpO2 97%  There is no height or weight on file to calculate BMI. Physical Exam Vitals and nursing note reviewed.  Constitutional:      Appearance: Normal appearance. She is well-developed. She is obese.  HENT:     Head: Normocephalic and atraumatic.     Right Ear: Tympanic membrane and external ear normal.     Left Ear: Tympanic membrane and external ear normal.     Nose: Nose normal.     Mouth/Throat:     Mouth: Mucous membranes are moist.     Pharynx: Oropharynx is clear.  Eyes:     Conjunctiva/sclera: Conjunctivae normal.  Cardiovascular:     Rate and Rhythm: Normal rate and regular rhythm.     Heart sounds: Normal heart sounds. No murmur heard.  No  friction rub. No gallop.   Pulmonary:     Effort: Pulmonary effort is normal.     Breath sounds: Normal breath sounds. No wheezing, rhonchi or rales.  Musculoskeletal:     Cervical back: Neck supple.  Skin:    General: Skin is warm.     Findings: No rash.  Neurological:     Mental Status: She is alert and oriented to person, place, and time.  Psychiatric:        Mood and Affect: Mood normal.        Behavior: Behavior normal.    Previous notes and tests were reviewed. The plan was reviewed with the patient/family, and all questions/concerned were addressed.  It was my pleasure to see Ellington today and participate in her care. Please feel free to contact me with any questions or concerns.  Sincerely,  Wyline Mood, DO Allergy & Immunology  [facility] office: [phone] [facility] office: [phone] [place] office: [phone]

## 2020-06-27 ENCOUNTER — Encounter: Payer: Self-pay | Admitting: Allergy

## 2020-06-27 ENCOUNTER — Ambulatory Visit (INDEPENDENT_AMBULATORY_CARE_PROVIDER_SITE_OTHER): Payer: 59 | Admitting: Allergy

## 2020-06-27 ENCOUNTER — Other Ambulatory Visit: Payer: Self-pay

## 2020-06-27 ENCOUNTER — Telehealth: Payer: Self-pay

## 2020-06-27 VITALS — BP 130/80 | HR 84 | Resp 19

## 2020-06-27 DIAGNOSIS — Z8709 Personal history of other diseases of the respiratory system: Secondary | ICD-10-CM | POA: Diagnosis not present

## 2020-06-27 DIAGNOSIS — J3089 Other allergic rhinitis: Secondary | ICD-10-CM

## 2020-06-27 DIAGNOSIS — J309 Allergic rhinitis, unspecified: Secondary | ICD-10-CM

## 2020-06-27 DIAGNOSIS — H1013 Acute atopic conjunctivitis, bilateral: Secondary | ICD-10-CM | POA: Insufficient documentation

## 2020-06-27 MED ORDER — MONTELUKAST SODIUM 10 MG PO TABS: 10.0000 mg | ORAL_TABLET | Freq: Every day | ORAL | 5 refills | Status: AC

## 2020-06-27 NOTE — Telephone Encounter (Signed)
Patient was seen in office today and informed Dr. Selena Batten that she never heard from the ENT. Dr. Selena Batten is wondering if we can please send referral to ENT again for nasal congestion. Thank you.

## 2020-06-27 NOTE — Assessment & Plan Note (Addendum)
Past history - Perennial rhinitis symptoms with headaches, sinus pressure and nasal congestion for 10 years.  Tried over-the-counter antihistamines, Flonase, azelastine and Singulair with minimal benefit.  2016 and intradermal testing was positive to dust mites, cockroach and few molds. 2021  intradermal skin testing showed: positive to dust mites and cockroaches. 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 No ENT evaluation. Having dry and itchy eyes.   Restart weekly allergy injections - given today.   Continue environmental control measures as below. Start Singulair (montelukast) 10mg  daily at night. Cautioned that in some children/adults can experience behavioral changes including hyperactivity, agitation, depression, sleep disturbances and suicidal ideations. These side effects are rare, but if you notice them you should notify me and discontinue Singulair (montelukast).  May take carbinoxamine 4mg  tablet up to 3 times a day as needed for allergies. May use dymista (fluticasone + azelastine nasal spray combination) 1 spray per nostril twice a day.   Nasal saline spray (i.e., Simply Saline) or nasal saline lavage (i.e., NeilMed) is recommended as needed and prior to medicated nasal sprays.  May use refresh eye drops for the eyes.   Refer to ENT - Dr. for evaluation for chronic nasal congestion.

## 2020-06-27 NOTE — Assessment & Plan Note (Signed)
Past history - frequent sinus infections which may be contributed by her perennial allergens. Interim history - no infections since last visit.   Monitor symptoms and if still persistent then will consider obtaining basic immune evaluation.

## 2020-06-27 NOTE — Assessment & Plan Note (Signed)
   See assessment and plan as above for allergic rhinitis.  

## 2020-06-27 NOTE — Patient Instructions (Addendum)
Environmental allergies:  2021 skin testing showed: positive to dust mites and cockroaches.  Restart weekly allergy injections.   Continue environmental control measures as below. Start Singulair (montelukast) 10mg  daily at night. Cautioned that in some children/adults can experience behavioral changes including hyperactivity, agitation, depression, sleep disturbances and suicidal ideations. These side effects are rare, but if you notice them you should notify me and discontinue Singulair (montelukast).  May take carbinoxamine 4mg  tablet up to 3 times a day as needed for allergies. May use dymista (fluticasone + azelastine nasal spray combination) 1 spray per nostril twice a day.   Nasal saline spray (i.e., Simply Saline) or nasal saline lavage (i.e., NeilMed) is recommended as needed and prior to medicated nasal sprays.  May use refresh eye drops for the eyes.   Keep track of infections.   Refer to ENT - Dr. for evaluation for chronic nasal congestion.   Follow up in 6 months or sooner if needed.  Follow up with neurology for your headaches.   Control of House Dust Mite Allergen . Dust mite allergens are a common trigger of allergy and asthma symptoms. While they can be found throughout the house, these microscopic creatures thrive in warm, humid environments such as bedding, upholstered furniture and carpeting. . Because so much time is spent in the bedroom, it is essential to reduce mite levels there.  . Encase pillows, mattresses, and box springs in special allergen-proof fabric covers or airtight, zippered plastic covers.  . Bedding should be washed weekly in hot water (130 F) and dried in a hot dryer. Allergen-proof covers are available for comforters and pillows that can't be regularly washed.  the allergy-proof covers every few months. Minimize clutter in the bedroom. Keep pets out of the bedroom.  Suszanne Conners Keep humidity less than 50% by using a dehumidifier or air  conditioning. You can buy a humidity measuring device called a hygrometer to monitor this.  . If possible, replace carpets with hardwood, linoleum, or washable area rugs. If that's not possible, vacuum frequently with a vacuum that has a HEPA filter. . Remove all upholstered furniture and non-washable window drapes from the bedroom. . Remove all non-washable stuffed toys from the bedroom.  Wash stuffed toys weekly. Cockroach Allergen Avoidance Cockroaches are often found in the homes of densely populated urban areas, schools or commercial buildings, but these creatures can lurk almost anywhere. This does not mean that you have a dirty house or living area. . Block all areas where roaches can enter the home. This includes crevices, wall cracks and windows.  . Cockroaches need water to survive, so fix and seal all leaky faucets and pipes. Have an exterminator go through the house when your family and pets are gone to eliminate any remaining roaches. Reyes Ivan Keep food in lidded containers and put pet food dishes away after your pets are done eating. Vacuum and sweep the floor after meals, and take out garbage and recyclables. Use lidded garbage containers in the kitchen. Wash dishes immediately after use and clean under stoves, refrigerators or toasters where crumbs can accumulate. Wipe off the stove and other kitchen surfaces and cupboards regularly.

## 2020-06-29 NOTE — Telephone Encounter (Signed)
Noted, thank you

## 2020-06-29 NOTE — Telephone Encounter (Signed)
I have placed this referral back to Dr Avel Sensor office. I will give them a call tomorrow to make sure this was received.   Thanks

## 2020-07-05 NOTE — Telephone Encounter (Signed)
08-04-20 with Dr Suszanne Conners.

## 2020-10-19 ENCOUNTER — Other Ambulatory Visit: Payer: Self-pay

## 2020-10-19 MED ORDER — CARBINOXAMINE MALEATE 4 MG PO TABS: ORAL_TABLET | ORAL | 5 refills | Status: AC

## 2020-12-26 ENCOUNTER — Ambulatory Visit: Payer: 59 | Admitting: Allergy

## 2021-01-25 ENCOUNTER — Ambulatory Visit: Payer: 59 | Admitting: Allergy

## 2021-02-07 NOTE — Progress Notes (Deleted)
Follow Up Note  RE: Whitney Schultz MRN: [id number] DOB: Dec 19, 1975 Date of Office Visit: 02/08/2021  Referring provider: Gweneth Dimitri, MD Primary care provider: Gweneth Dimitri, MD  Chief Complaint: No chief complaint on file.  History of Present Illness: I had the pleasure of seeing Whitney Schultz for a follow up visit at the [facility] on 02/07/2021. She is a 45 y.o. female, who is being followed for allergic rhinoconjunctivitis and history of frequent upper respiratory infections. Her previous allergy office visit was on 06/27/2020 with Dr. Selena Batten. Today is a regular follow up visit.  Perennial allergic rhinitis Past history - Perennial rhinitis symptoms with headaches, sinus pressure and nasal congestion for 10 years.  Tried over-the-counter antihistamines, Flonase, azelastine and Singulair with minimal benefit.  2016 and intradermal testing was positive to dust mites, cockroach and few molds. 2021  intradermal skin testing showed: positive to dust mites and cockroaches. 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 No ENT evaluation. Having dry and itchy eyes.   Restart weekly allergy injections - given today.   Continue environmental control measures as below.  Start Singulair (montelukast) 10mg  daily at night.  Cautioned that in some children/adults can experience behavioral changes including hyperactivity, agitation, depression, sleep disturbances and suicidal ideations. These side effects are rare, but if you notice them you should notify me and discontinue Singulair (montelukast).  May take carbinoxamine 4mg  tablet up to 3 times a day as needed for allergies.  May use dymista (fluticasone + azelastine nasal spray combination) 1 spray per nostril twice a day.   Nasal saline spray (i.e., Simply Saline) or nasal saline lavage (i.e., NeilMed) is recommended as needed and prior to medicated nasal sprays.  May  use refresh eye drops for the eyes.   Refer to ENT - Dr. for evaluation for chronic nasal congestion.   Allergic conjunctivitis of both eyes  See assessment and plan as above for allergic rhinitis.  History of upper respiratory infection Past history - frequent sinus infections which may be contributed by her perennial allergens. Interim history - no infections since last visit.   Monitor symptoms and if still persistent then will consider obtaining basic immune evaluation.  Return in about 6 months (around 12/25/2020).  Assessment and Plan: Whitney Schultz is a 45 y.o. female with: No problem-specific Assessment & Plan notes found for this encounter.  No follow-ups on file.  No orders of the defined types were placed in this encounter.  Lab Orders  No laboratory test(s) ordered today    Diagnostics: Spirometry:  Tracings reviewed. Her effort: {Blank single:19197::"Good reproducible efforts.","It was hard to get consistent efforts and there is a question as to whether this reflects a maximal maneuver.","Poor effort, data can not be interpreted."} FVC: ***L FEV1: ***L, ***% predicted FEV1/FVC ratio: ***% Interpretation: {Blank single:19197::"Spirometry consistent with mild obstructive disease","Spirometry consistent with moderate obstructive disease","Spirometry consistent with severe obstructive disease","Spirometry consistent with possible restrictive disease","Spirometry consistent with mixed obstructive and restrictive disease","Spirometry uninterpretable due to technique","Spirometry consistent with normal pattern","No overt abnormalities noted given today's efforts"}.  Please see scanned spirometry results for details.  Skin Testing: {Blank single:19197::"Select foods","Environmental allergy panel","Environmental allergy panel and select foods","Food allergy panel","None","Deferred due to recent antihistamines use"}. Positive test to: ***. Negative test to: ***.  Results  discussed with patient/family.   Medication List:  Current Outpatient Medications  Medication Sig Dispense Refill  . AJOVY 225 MG/1.5ML SOAJ Inject into the skin.    Claudette Head  amitriptyline (ELAVIL) 10 MG tablet     . Azelastine-Fluticasone 137-50 MCG/ACT SUSP Place 1 spray into the nose in the morning and at bedtime. 23 g 5  . Carbinoxamine Maleate 4 MG TABS Take 1 to 1.5 tablets every 8 hours as needed for allergies. 45 tablet 5  . cyclobenzaprine (FLEXERIL) 10 MG tablet Take 10 mg by mouth 3 (three) times daily as needed.    . eletriptan (RELPAX) 20 MG tablet Take 20 mg by mouth as needed for migraine or headache. One tablet by mouth at onset of headache. May repeat in 2 hours if headache persists or recurs.    . furosemide (LASIX) 20 MG tablet Take 20 mg by mouth daily.    . metFORMIN (GLUCOPHAGE-XR) 500 MG 24 hr tablet Take one po daily for a week and then increase to 2 po daily    . montelukast (SINGULAIR) 10 MG tablet Take 1 tablet (10 mg total) by mouth at bedtime. 30 tablet 5  . naproxen sodium (ALEVE) 220 MG tablet Take 220 mg by mouth.    Marland Kitchen omeprazole (PRILOSEC) 40 MG capsule Take 40 mg by mouth daily.    . ondansetron (ZOFRAN) 4 MG tablet TAKE 1 TO 2 TABLETS BY MOUTH AT ONSET OF HEADACHE UP TO TWICE DAILY.    Marland Kitchen prochlorperazine (COMPAZINE) 10 MG tablet Take by mouth.    . SUMAtriptan (IMITREX) 100 MG tablet Take 100 mg by mouth 2 (two) times daily as needed.    . SUMAtriptan (IMITREX) 50 MG tablet Take 50 mg by mouth every 2 (two) hours as needed for migraine or headache. May repeat in 2 hours if headache persists or recurs.    . topiramate (TOPAMAX) 50 MG tablet TAKE 1 TABLET BY MOUTH DAILY AT NOON FOR 2 WEEKS. INCREASE TO 2 BY MOUTH DAILY AT NOON     No current facility-administered medications for this visit.   Allergies: Allergies  Allergen Reactions  . Prednisone Rash   I reviewed her past medical history, social history, family history, and environmental history and no  significant changes have been reported from her previous visit.  Review of Systems  Constitutional: Negative for appetite change, chills, fever and unexpected weight change.  HENT: Positive for congestion, ear pain, postnasal drip, rhinorrhea, sinus pressure, sinus pain and sneezing.   Eyes: Positive for itching.  Respiratory: Negative for cough, chest tightness, shortness of breath and wheezing.   Cardiovascular: Negative for chest pain.  Gastrointestinal: Negative for abdominal pain.  Genitourinary: Negative for difficulty urinating.  Skin: Negative for rash.  Allergic/Immunologic: Positive for environmental allergies. Negative for food allergies.  Neurological: Positive for headaches.   Objective: LMP 01/25/2013  There is no height or weight on file to calculate BMI. Physical Exam Vitals and nursing note reviewed.  Constitutional:      Appearance: Normal appearance. She is well-developed. She is obese.  HENT:     Head: Normocephalic and atraumatic.     Right Ear: Tympanic membrane and external ear normal.     Left Ear: Tympanic membrane and external ear normal.     Nose: Nose normal.     Mouth/Throat:     Mouth: Mucous membranes are moist.     Pharynx: Oropharynx is clear.  Eyes:     Conjunctiva/sclera: Conjunctivae normal.  Cardiovascular:     Rate and Rhythm: Normal rate and regular rhythm.     Heart sounds: Normal heart sounds. No murmur heard. No friction rub. No gallop.   Pulmonary:  Effort: Pulmonary effort is normal.     Breath sounds: Normal breath sounds. No wheezing, rhonchi or rales.  Musculoskeletal:     Cervical back: Neck supple.  Skin:    General: Skin is warm.     Findings: No rash.  Neurological:     Mental Status: She is alert and oriented to person, place, and time.  Psychiatric:        Mood and Affect: Mood normal.        Behavior: Behavior normal.    Previous notes and tests were reviewed. The plan was reviewed with the patient/family, and  all questions/concerned were addressed.  It was my pleasure to see Tsion today and participate in her care. Please feel free to contact me with any questions or concerns.  Sincerely,  Wyline Mood, DO Allergy & Immunology  [facility] office: [phone] [facility] office: [phone]

## 2021-02-08 ENCOUNTER — Ambulatory Visit: Payer: 59 | Admitting: Allergy

## 2021-02-08 DIAGNOSIS — J3089 Other allergic rhinitis: Secondary | ICD-10-CM

## 2021-02-08 DIAGNOSIS — Z8709 Personal history of other diseases of the respiratory system: Secondary | ICD-10-CM

## 2021-02-08 DIAGNOSIS — H1013 Acute atopic conjunctivitis, bilateral: Secondary | ICD-10-CM

## 2021-02-15 ENCOUNTER — Ambulatory Visit: Payer: 59 | Admitting: Allergy

## 2021-04-12 ENCOUNTER — Ambulatory Visit: Payer: 59 | Admitting: Allergy

## 2021-04-12 DIAGNOSIS — J309 Allergic rhinitis, unspecified: Secondary | ICD-10-CM

## 2021-04-12 NOTE — Progress Notes (Deleted)
Follow Up Note  RE: Whitney Schultz MRN: [id number] DOB: [birth year] Date of Office Visit: 04/12/2021  Referring provider: Gweneth Dimitri, MD Primary care provider: Gweneth Dimitri, MD  Chief Complaint: No chief complaint on file.  History of Present Illness: I had the pleasure of seeing Whitney Schultz for a follow up visit at the [facility] on 04/12/2021. She is a 45 y.o. female, who is being followed for allergic rhinoconjunctivitis and history of upper respiratory infections. Her previous allergy office visit was on 06/27/2020 with Dr. Selena Batten. Today is a regular follow up visit.  Perennial allergic rhinitis Past history - Perennial rhinitis symptoms with headaches, sinus pressure and nasal congestion for 10 years.  Tried over-the-counter antihistamines, Flonase, azelastine and Singulair with minimal benefit.  2016 and intradermal testing was positive to dust mites, cockroach and few molds. 2021  intradermal skin testing showed: positive to dust mites and cockroaches. 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 No ENT evaluation. Having dry and itchy eyes.  Restart weekly allergy injections - given today.  Continue environmental control measures as below. Start Singulair (montelukast) 10mg  daily at night. Cautioned that in some children/adults can experience behavioral changes including hyperactivity, agitation, depression, sleep disturbances and suicidal ideations. These side effects are rare, but if you notice them you should notify me and discontinue Singulair (montelukast). May take carbinoxamine 4mg  tablet up to 3 times a day as needed for allergies. May use dymista (fluticasone + azelastine nasal spray combination) 1 spray per nostril twice a day. Nasal saline spray (i.e., Simply Saline) or nasal saline lavage (i.e., NeilMed) is recommended as needed and prior to medicated nasal sprays. May use refresh eye drops for  the eyes. Refer to ENT - Dr. for evaluation for chronic nasal congestion.    Allergic conjunctivitis of both eyes See assessment and plan as above for allergic rhinitis.   History of upper respiratory infection Past history - frequent sinus infections which may be contributed by her perennial allergens. Interim history - no infections since last visit.  Monitor symptoms and if still persistent then will consider obtaining basic immune evaluation.   Return in about 6 months (around 12/25/2020).  Assessment and Plan: [name] is a 45 y.o. female with: No problem-specific Assessment & Plan notes found for this encounter.  No follow-ups on file.  No orders of the defined types were placed in this encounter.  Lab Orders  No laboratory test(s) ordered today    Diagnostics: Spirometry:  Tracings reviewed. Her effort: {Blank single:19197::"Good reproducible efforts.","It was hard to get consistent efforts and there is a question as to whether this reflects a maximal maneuver.","Poor effort, data can not be interpreted."} FVC: ***L FEV1: ***L, ***% predicted FEV1/FVC ratio: ***% Interpretation: {Blank single:19197::"Spirometry consistent with mild obstructive disease","Spirometry consistent with moderate obstructive disease","Spirometry consistent with severe obstructive disease","Spirometry consistent with possible restrictive disease","Spirometry consistent with mixed obstructive and restrictive disease","Spirometry uninterpretable due to technique","Spirometry consistent with normal pattern","No overt abnormalities noted given today's efforts"}.  Please see scanned spirometry results for details.  Skin Testing: {Blank single:19197::"Select foods","Environmental allergy panel","Environmental allergy panel and select foods","Food allergy panel","None","Deferred due to recent antihistamines use"}. Positive test to: ***. Negative test to: ***.  Results discussed with  patient/family.   Medication List:  Current Outpatient Medications  Medication Sig Dispense Refill  . AJOVY 225 MG/1.5ML SOAJ Inject into the skin.    Claudette Head amitriptyline (ELAVIL) 10 MG tablet     .  Azelastine-Fluticasone 137-50 MCG/ACT SUSP Place 1 spray into the nose in the morning and at bedtime. 23 g 5  . Carbinoxamine Maleate 4 MG TABS Take 1 to 1.5 tablets every 8 hours as needed for allergies. 45 tablet 5  . cyclobenzaprine (FLEXERIL) 10 MG tablet Take 10 mg by mouth 3 (three) times daily as needed.    . eletriptan (RELPAX) 20 MG tablet Take 20 mg by mouth as needed for migraine or headache. One tablet by mouth at onset of headache. May repeat in 2 hours if headache persists or recurs.    . furosemide (LASIX) 20 MG tablet Take 20 mg by mouth daily.    . metFORMIN (GLUCOPHAGE-XR) 500 MG 24 hr tablet Take one po daily for a week and then increase to 2 po daily    . montelukast (SINGULAIR) 10 MG tablet Take 1 tablet (10 mg total) by mouth at bedtime. 30 tablet 5  . naproxen sodium (ALEVE) 220 MG tablet Take 220 mg by mouth.    Marland Kitchen omeprazole (PRILOSEC) 40 MG capsule Take 40 mg by mouth daily.    . ondansetron (ZOFRAN) 4 MG tablet TAKE 1 TO 2 TABLETS BY MOUTH AT ONSET OF HEADACHE UP TO TWICE DAILY.    Marland Kitchen prochlorperazine (COMPAZINE) 10 MG tablet Take by mouth.    . SUMAtriptan (IMITREX) 100 MG tablet Take 100 mg by mouth 2 (two) times daily as needed.    . SUMAtriptan (IMITREX) 50 MG tablet Take 50 mg by mouth every 2 (two) hours as needed for migraine or headache. May repeat in 2 hours if headache persists or recurs.    . topiramate (TOPAMAX) 50 MG tablet TAKE 1 TABLET BY MOUTH DAILY AT NOON FOR 2 WEEKS. INCREASE TO 2 BY MOUTH DAILY AT NOON     No current facility-administered medications for this visit.   Allergies: Allergies  Allergen Reactions  . Prednisone Rash   I reviewed her past medical history, social history, family history, and environmental history and no significant changes  have been reported from her previous visit.  Review of Systems  Constitutional:  Negative for appetite change, chills, fever and unexpected weight change.  HENT:  Positive for congestion, ear pain, postnasal drip, rhinorrhea, sinus pressure, sinus pain and sneezing.   Eyes:  Positive for itching.  Respiratory:  Negative for cough, chest tightness, shortness of breath and wheezing.   Cardiovascular:  Negative for chest pain.  Gastrointestinal:  Negative for abdominal pain.  Genitourinary:  Negative for difficulty urinating.  Skin:  Negative for rash.  Allergic/Immunologic: Positive for environmental allergies. Negative for food allergies.  Neurological:  Positive for headaches.   Objective: LMP 01/25/2013  There is no height or weight on file to calculate BMI. Physical Exam Vitals and nursing note reviewed.  Constitutional:      Appearance: Normal appearance. She is well-developed. She is obese.  HENT:     Head: Normocephalic and atraumatic.     Right Ear: Tympanic membrane and external ear normal.     Left Ear: Tympanic membrane and external ear normal.     Nose: Nose normal.     Mouth/Throat:     Mouth: Mucous membranes are moist.     Pharynx: Oropharynx is clear.  Eyes:     Conjunctiva/sclera: Conjunctivae normal.  Cardiovascular:     Rate and Rhythm: Normal rate and regular rhythm.     Heart sounds: Normal heart sounds. No murmur heard.   No friction rub. No gallop.  Pulmonary:  Effort: Pulmonary effort is normal.     Breath sounds: Normal breath sounds. No wheezing, rhonchi or rales.  Musculoskeletal:     Cervical back: Neck supple.  Skin:    General: Skin is warm.     Findings: No rash.  Neurological:     Mental Status: She is alert and oriented to person, place, and time.  Psychiatric:        Mood and Affect: Mood normal.        Behavior: Behavior normal.  Previous notes and tests were reviewed. The plan was reviewed with the patient/family, and all  questions/concerned were addressed.  It was my pleasure to see Lynia today and participate in her care. Please feel free to contact me with any questions or concerns.  Sincerely,  Wyline Mood, DO Allergy & Immunology  [facility] office: [phone] [facility] office: [phone]

## 2021-12-26 DIAGNOSIS — R0681 Apnea, not elsewhere classified: Secondary | ICD-10-CM | POA: Diagnosis not present

## 2021-12-26 DIAGNOSIS — G43709 Chronic migraine without aura, not intractable, without status migrainosus: Secondary | ICD-10-CM | POA: Diagnosis not present

## 2022-04-16 DIAGNOSIS — R7303 Prediabetes: Secondary | ICD-10-CM | POA: Diagnosis not present

## 2022-04-16 DIAGNOSIS — F419 Anxiety disorder, unspecified: Secondary | ICD-10-CM | POA: Diagnosis not present

## 2022-04-16 DIAGNOSIS — R632 Polyphagia: Secondary | ICD-10-CM | POA: Diagnosis not present

## 2022-05-01 DIAGNOSIS — Z6839 Body mass index (BMI) 39.0-39.9, adult: Secondary | ICD-10-CM | POA: Diagnosis not present

## 2022-05-01 DIAGNOSIS — G43709 Chronic migraine without aura, not intractable, without status migrainosus: Secondary | ICD-10-CM | POA: Diagnosis not present

## 2022-05-01 DIAGNOSIS — R0681 Apnea, not elsewhere classified: Secondary | ICD-10-CM | POA: Diagnosis not present

## 2022-06-04 DIAGNOSIS — R609 Edema, unspecified: Secondary | ICD-10-CM | POA: Diagnosis not present

## 2022-06-04 DIAGNOSIS — R5382 Chronic fatigue, unspecified: Secondary | ICD-10-CM | POA: Diagnosis not present

## 2022-06-04 DIAGNOSIS — R7303 Prediabetes: Secondary | ICD-10-CM | POA: Diagnosis not present

## 2022-06-24 DIAGNOSIS — R2242 Localized swelling, mass and lump, left lower limb: Secondary | ICD-10-CM | POA: Diagnosis not present

## 2022-06-24 DIAGNOSIS — Z23 Encounter for immunization: Secondary | ICD-10-CM | POA: Diagnosis not present

## 2022-06-24 DIAGNOSIS — E782 Mixed hyperlipidemia: Secondary | ICD-10-CM | POA: Diagnosis not present

## 2022-06-26 ENCOUNTER — Other Ambulatory Visit (HOSPITAL_BASED_OUTPATIENT_CLINIC_OR_DEPARTMENT_OTHER): Payer: Self-pay | Admitting: Family Medicine

## 2022-07-09 ENCOUNTER — Other Ambulatory Visit (HOSPITAL_BASED_OUTPATIENT_CLINIC_OR_DEPARTMENT_OTHER): Payer: Self-pay | Admitting: Family Medicine

## 2022-07-09 ENCOUNTER — Telehealth (HOSPITAL_BASED_OUTPATIENT_CLINIC_OR_DEPARTMENT_OTHER): Payer: Self-pay

## 2022-07-09 DIAGNOSIS — R2242 Localized swelling, mass and lump, left lower limb: Secondary | ICD-10-CM

## 2022-07-11 ENCOUNTER — Telehealth (HOSPITAL_BASED_OUTPATIENT_CLINIC_OR_DEPARTMENT_OTHER): Payer: Self-pay

## 2022-07-31 DIAGNOSIS — G43009 Migraine without aura, not intractable, without status migrainosus: Secondary | ICD-10-CM | POA: Diagnosis not present

## 2022-07-31 DIAGNOSIS — R609 Edema, unspecified: Secondary | ICD-10-CM | POA: Diagnosis not present

## 2022-10-24 ENCOUNTER — Ambulatory Visit (INDEPENDENT_AMBULATORY_CARE_PROVIDER_SITE_OTHER): Payer: Self-pay | Admitting: Podiatry

## 2022-10-24 ENCOUNTER — Encounter: Payer: Self-pay | Admitting: Podiatry

## 2022-10-24 DIAGNOSIS — B070 Plantar wart: Secondary | ICD-10-CM

## 2022-10-24 DIAGNOSIS — B353 Tinea pedis: Secondary | ICD-10-CM

## 2022-10-24 MED ORDER — KETOCONAZOLE 2 % EX CREA: 1.0000 | TOPICAL_CREAM | Freq: Every day | CUTANEOUS | 2 refills | Status: DC

## 2022-10-24 NOTE — Progress Notes (Signed)
  Subjective:  Patient ID: Whitney Schultz, female    DOB: Aug 31, 1976,   MRN: [id number]  Chief Complaint  Patient presents with   Nail Problem    Foot fungus for over 3 weeks getting worse    47 y.o. female presents for concern for fungus in her feet that has been going on for about 3 weeks and getting worse. Relates she has been using tea tree oil and apple cider vinegars soaks to help but they have not been improving. Relates they itch and it is starting now on the left foot as well. There are two spots on the arch on the right that have been there for years and get painful she has tried tea tree oil on these  . Denies any other pedal complaints. Denies n/v/f/c.   Past Medical History:  Diagnosis Date   Anemia    Angio-edema    GERD (gastroesophageal reflux disease)    Insomnia    Migraines    Urticaria     Objective:  Physical Exam: Vascular: DP/PT pulses 2/4 bilateral. CFT <3 seconds. Normal hair growth on digits. No edema.  Skin. No lacerations or abrasions bilateral feet. Two lesions noted to medial arch on right foot. Multiple capillary budding is noted throughout with cauliflower-like appearance and loss of skin tension lines within the lesion itself.  Scaling and erythema noted interdigitally as well as along plantar foot. Some maceration noted as well in right foot interspaces and on left foot second interspace.  Musculoskeletal: MMT 5/5 bilateral lower extremities in DF, PF, Inversion and Eversion. Deceased ROM in DF of ankle joint.  Neurological: Sensation intact to light touch.   Assessment:   1. Tinea pedis of both feet   2. Plantar wart of right foot      Plan:  Patient was evaluated and treated and all questions answered. Discussed tinea pedis and wart treatments.  -Discussed warts and their etiology with patient and treatment options.  -Hyperkeratotic tissue was debrided with chisel without incident.  -Applied salycylic acid treatment to area with  dressing. Advised to remove bandaging tomorrow.  -Ketoconazole prescribed for tinea to be applied at night and in mornings will use betadine to dry out macerated areas.  -Patient to return to office in 3 weeks or sooner if condition worsens.    Lorenda Peck, DPM   c

## 2022-11-01 ENCOUNTER — Encounter: Payer: Self-pay | Admitting: Podiatry

## 2022-11-15 ENCOUNTER — Ambulatory Visit: Payer: Self-pay | Admitting: Podiatry

## 2022-11-29 ENCOUNTER — Ambulatory Visit (INDEPENDENT_AMBULATORY_CARE_PROVIDER_SITE_OTHER): Payer: BC Managed Care – PPO | Admitting: Podiatry

## 2022-11-29 ENCOUNTER — Encounter: Payer: Self-pay | Admitting: Podiatry

## 2022-11-29 DIAGNOSIS — B353 Tinea pedis: Secondary | ICD-10-CM

## 2022-11-29 MED ORDER — TRIAMCINOLONE ACETONIDE 0.1 % EX CREA: 1.0000 | TOPICAL_CREAM | Freq: Two times a day (BID) | CUTANEOUS | 0 refills | Status: AC

## 2022-11-29 MED ORDER — KETOCONAZOLE 2 % EX CREA: 1.0000 | TOPICAL_CREAM | Freq: Every day | CUTANEOUS | 2 refills | Status: AC

## 2022-11-29 NOTE — Progress Notes (Signed)
  Subjective:  Patient ID: Whitney Schultz, female    DOB: 08-12-1976,   MRN: [id number]  Chief Complaint  Patient presents with   Nail Problem    Patient states fungus on the left foot is not improving , states her foot is still itchy between her toes.     47 y.o. female presents for follow-up of fungus in her feet . Relates she has been using the cream and betadine.  Relates they have  been improving but still itching between her toes. There are two spots on the arch on the right that have been there and these have disappeared.  Denies any other pedal complaints. Denies n/v/f/c.   Past Medical History:  Diagnosis Date   Anemia    Angio-edema    GERD (gastroesophageal reflux disease)    Insomnia    Migraines    Urticaria     Objective:  Physical Exam: Vascular: DP/PT pulses 2/4 bilateral. CFT <3 seconds. Normal hair growth on digits. No edema.  Skin. No lacerations or abrasions bilateral feet. Two lesions noted to medial arch on right foot. Appear improved.  Scaling and erythema noted interdigitally as well as along plantar foot. Some maceration noted as well in right foot interspaces and on left foot second interspace. Improved.  Musculoskeletal: MMT 5/5 bilateral lower extremities in DF, PF, Inversion and Eversion. Deceased ROM in DF of ankle joint.  Neurological: Sensation intact to light touch.   Assessment:   1. Tinea pedis of both feet       Plan:  Patient was evaluated and treated and all questions answered. Discussed tinea pedis and wart treatments.  -Discussed warts and their etiology with patient and treatment options.  -Warts seem to be improved.  -Ketoconazole prescribed for tinea to be applied at night and will add on triamcinolone to be applied in morning and will discontinue the betadine.  -Patient to return to office in 6 weeks or sooner if condition worsens.    Lorenda Peck, DPM   c

## 2023-01-24 ENCOUNTER — Ambulatory Visit (INDEPENDENT_AMBULATORY_CARE_PROVIDER_SITE_OTHER): Payer: BC Managed Care – PPO | Admitting: Podiatry

## 2023-01-24 DIAGNOSIS — Z91199 Patient's noncompliance with other medical treatment and regimen due to unspecified reason: Secondary | ICD-10-CM

## 2023-01-24 NOTE — Progress Notes (Signed)
No show
